# Patient Record
Sex: Male | Born: 1945 | Race: White | Hispanic: No | Marital: Single | Smoking: Current some day smoker
Health system: Southern US, Community
[De-identification: ages and names within clinical notes are randomized; demographics above are authoritative.]

## PROBLEM LIST (undated history)

## (undated) DIAGNOSIS — F32A Depression, unspecified: Secondary | ICD-10-CM

## (undated) DIAGNOSIS — K7460 Unspecified cirrhosis of liver: Secondary | ICD-10-CM

## (undated) DIAGNOSIS — H269 Unspecified cataract: Secondary | ICD-10-CM

## (undated) DIAGNOSIS — H9190 Unspecified hearing loss, unspecified ear: Secondary | ICD-10-CM

## (undated) DIAGNOSIS — R1310 Dysphagia, unspecified: Secondary | ICD-10-CM

## (undated) DIAGNOSIS — M5030 Other cervical disc degeneration, unspecified cervical region: Secondary | ICD-10-CM

## (undated) DIAGNOSIS — K219 Gastro-esophageal reflux disease without esophagitis: Secondary | ICD-10-CM

## (undated) DIAGNOSIS — H16009 Unspecified corneal ulcer, unspecified eye: Secondary | ICD-10-CM

## (undated) DIAGNOSIS — J302 Other seasonal allergic rhinitis: Secondary | ICD-10-CM

## (undated) DIAGNOSIS — J309 Allergic rhinitis, unspecified: Secondary | ICD-10-CM

## (undated) DIAGNOSIS — C760 Malignant neoplasm of head, face and neck: Secondary | ICD-10-CM

## (undated) DIAGNOSIS — F329 Major depressive disorder, single episode, unspecified: Secondary | ICD-10-CM

## (undated) HISTORY — DX: Allergic rhinitis, unspecified: J30.9

## (undated) HISTORY — PX: TONSILLECTOMY: SUR1361

## (undated) HISTORY — DX: Depression, unspecified: F32.A

## (undated) HISTORY — DX: Other seasonal allergic rhinitis: J30.2

## (undated) HISTORY — DX: Other cervical disc degeneration, unspecified cervical region: M50.30

## (undated) HISTORY — DX: Dysphagia, unspecified: R13.10

## (undated) HISTORY — DX: Major depressive disorder, single episode, unspecified: F32.9

## (undated) HISTORY — PX: EYE SURGERY: SHX253

## (undated) HISTORY — PX: ESOPHAGOGASTRODUODENOSCOPY: SHX1529

## (undated) HISTORY — DX: Malignant neoplasm of head, face and neck: C76.0

## (undated) HISTORY — DX: Unspecified hearing loss, unspecified ear: H91.90

## (undated) HISTORY — DX: Unspecified cataract: H26.9

## (undated) HISTORY — DX: Unspecified cirrhosis of liver: K74.60

## (undated) HISTORY — PX: PEG PLACEMENT: SHX5437

## (undated) HISTORY — PX: LARYNGOSCOPY: SUR817

## (undated) HISTORY — DX: Gastro-esophageal reflux disease without esophagitis: K21.9

## (undated) HISTORY — DX: Unspecified corneal ulcer, unspecified eye: H16.009

## (undated) HISTORY — PX: TRACHEOSTOMY: SUR1362

---

## 2016-02-26 ENCOUNTER — Telehealth: Payer: Self-pay

## 2016-02-26 NOTE — Telephone Encounter (Signed)
I received a call from Hospice on this patient for leaking PEG tube. He had the tube put in at [facility] in 05/2015 and then it came out and had to go to [facility] ER to have it replaced on 01/2016. It has been doing okay until about a week ago. It is leaking around where it goes into his stomach. He is able to use it but he  Just want to fix the leaking. I have [facility] sending over the ER notes. His PCP is Dr. Orlena Sheldon in [place]. I am going to try and fine out who put in the PEG at [facility].

## 2016-02-28 NOTE — Telephone Encounter (Signed)
I was not here Thursday or Friday. Has this been addressed?

## 2016-02-29 NOTE — Telephone Encounter (Signed)
Noted. Thanks.

## 2016-02-29 NOTE — Telephone Encounter (Signed)
Yes he is coming in for an office visit on 03/01/16. He is able to use the PEG but it is just leaking around it. Just wanted you to know what was going on

## 2016-03-01 ENCOUNTER — Ambulatory Visit: Payer: Self-pay | Admitting: Gastroenterology

## 2016-03-16 ENCOUNTER — Ambulatory Visit (INDEPENDENT_AMBULATORY_CARE_PROVIDER_SITE_OTHER): Admitting: Nurse Practitioner

## 2016-03-16 ENCOUNTER — Encounter: Payer: Self-pay | Admitting: Nurse Practitioner

## 2016-03-16 ENCOUNTER — Telehealth: Payer: Self-pay

## 2016-03-16 ENCOUNTER — Encounter (HOSPITAL_COMMUNITY): Payer: Self-pay | Admitting: *Deleted

## 2016-03-16 ENCOUNTER — Ambulatory Visit (HOSPITAL_COMMUNITY)
Admission: RE | Admit: 2016-03-16 | Discharge: 2016-03-16 | Disposition: A | Discharge status: Home / Self Care | Source: Ambulatory Visit | Attending: Gastroenterology | Admitting: Gastroenterology

## 2016-03-16 ENCOUNTER — Ambulatory Visit (HOSPITAL_COMMUNITY)
Admission: RE | Admit: 2016-03-16 | Discharge: 2016-03-16 | Disposition: A | Discharge status: Home / Self Care | Source: Ambulatory Visit | Attending: Nurse Practitioner | Admitting: Nurse Practitioner

## 2016-03-16 ENCOUNTER — Encounter (HOSPITAL_COMMUNITY)
Admission: RE | Disposition: A | Discharge status: Home / Self Care | Payer: Self-pay | Source: Ambulatory Visit | Attending: Gastroenterology

## 2016-03-16 ENCOUNTER — Other Ambulatory Visit: Payer: Self-pay

## 2016-03-16 VITALS — BP 120/76 | HR 104 | Temp 97.7°F | Ht 65.0 in | Wt 113.2 lb

## 2016-03-16 DIAGNOSIS — K9423 Gastrostomy malfunction: Secondary | ICD-10-CM | POA: Insufficient documentation

## 2016-03-16 DIAGNOSIS — F329 Major depressive disorder, single episode, unspecified: Secondary | ICD-10-CM | POA: Diagnosis not present

## 2016-03-16 DIAGNOSIS — C760 Malignant neoplasm of head, face and neck: Secondary | ICD-10-CM | POA: Diagnosis not present

## 2016-03-16 DIAGNOSIS — R1310 Dysphagia, unspecified: Secondary | ICD-10-CM | POA: Insufficient documentation

## 2016-03-16 DIAGNOSIS — Z431 Encounter for attention to gastrostomy: Secondary | ICD-10-CM

## 2016-03-16 DIAGNOSIS — R110 Nausea: Secondary | ICD-10-CM | POA: Insufficient documentation

## 2016-03-16 DIAGNOSIS — F17210 Nicotine dependence, cigarettes, uncomplicated: Secondary | ICD-10-CM | POA: Insufficient documentation

## 2016-03-16 HISTORY — PX: PEG PLACEMENT: SHX5437

## 2016-03-16 SURGERY — REPLACEMENT, PEG TUBE, WITHOUT ENDOSCOPY
Anesthesia: LOCAL

## 2016-03-16 MED ORDER — SULFAMETHOXAZOLE-TRIMETHOPRIM 400-80 MG PO TABS: 1.0000 | ORAL_TABLET | Freq: Two times a day (BID) | ORAL | Status: DC

## 2016-03-16 MED ORDER — SODIUM CHLORIDE 0.9 % IV SOLN: INTRAVENOUS | Status: DC

## 2016-03-16 MED ORDER — CLOTRIMAZOLE 1 % EX CREA: 1.0000 "application " | TOPICAL_CREAM | Freq: Two times a day (BID) | CUTANEOUS | Status: AC

## 2016-03-16 MED ORDER — DIATRIZOATE MEGLUMINE & SODIUM 66-10 % PO SOLN
ORAL | Status: AC
  Filled 2016-03-16: qty 30

## 2016-03-16 NOTE — Telephone Encounter (Signed)
Open in error

## 2016-03-16 NOTE — Assessment & Plan Note (Addendum)
Patient with PEG placement at [facility] in July 2016. Was seen in the emergency department at [facility] in February 2017 for his PEG coming out. This was replaced with a different sized to based on supply availability. An 63 French tube was placed and a 20 [place] pole. Since then he has had persistent and worsening leaking of gastric juices and feeding from his PEG site. Noted air leak around the site as well. Skin appears with breakdown and drainage. Dr. Oneida Alar came into the exam room to examine the patient. At this point we will order a KUB with Gastrografin injection for PEG study to determine placement. The patient will be sent to endoscopy preprocedure area for possible bedside manipulation or tube change. Return for follow-up based on post manipulation recommendations.  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 He is dependent on PEG tube for all fluids and nutrition. He is having difficulty obtaining adequate adequate nutrition and fluids at this time due to the leak.

## 2016-03-16 NOTE — Interval H&P Note (Signed)
History and Physical Interval Note:  03/16/2016 1:32 PM  Philip Beck  has presented today for surgery, with the diagnosis of peg replacement  The various methods of treatment have been discussed with the patient and family. After consideration of risks, benefits and other options for treatment, the patient has consented to  Procedure(s) with comments: PERCUTANEOUS ENDOSCOPIC GASTROSTOMY (PEG) REPLACEMENT (N/A) - [id number] as a surgical intervention .  The patient's history has been reviewed, patient examined, no change in status, stable for surgery.  I have reviewed the patient's chart and labs.  Questions were answered to the patient's satisfaction.     [employer]

## 2016-03-16 NOTE — H&P (Addendum)
  Primary Care Physician:  Octavio Graves, DO Primary Gastroenterologist:  Dr. Oneida Alar  Pre-Procedure History & Physical: HPI:  Philip Beck is a 70 y.o. male here for PEG CHANGE-Cr 1.13 AT [facility] 13.  Past Medical History  Diagnosis Date  . Dysphagia   . 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 Depression   . GERD (gastroesophageal reflux disease)   . Corneal ulcer   . DDD (degenerative disc disease), cervical   . Cataract   . Cirrhosis ([place])   . Hearing loss   . Allergic rhinitis   . Seasonal allergies     Past Surgical History  Procedure Laterality Date  . Tracheostomy    . Peg placement    . Eye surgery Bilateral     cataract removeal  . Tonsillectomy    . Esophagogastroduodenoscopy    . Laryngoscopy      Prior to Admission medications   Medication Sig Start Date End Date Taking? Authorizing Provider  aspirin 325 MG tablet Take 325 mg by mouth daily.   Yes Historical Provider, MD  minocycline (DYNACIN) 50 MG tablet Take 50 mg by mouth 2 (two) times daily.   Yes Historical Provider, MD  oxyCODONE-acetaminophen (PERCOCET) 10-325 MG tablet Take 1 tablet by mouth every 4 (four) hours as needed for pain.   Yes Historical Provider, MD  ranitidine (ZANTAC) 150 MG capsule Take 150 mg by mouth 2 (two) times daily.   Yes Historical Provider, MD  spironolactone (ALDACTONE) 25 MG tablet Take 25 mg by mouth 2 (two) times daily.   Yes Historical Provider, MD  torsemide (DEMADEX) 20 MG tablet Take 20 mg by mouth daily.   Yes Historical Provider, MD    Allergies as of 03/16/2016 - Review Complete 03/16/2016  Allergen Reaction Noted  . Penicillins Itching 03/16/2016    Family History  Problem Relation Age of Onset  . Cancer Mother   . Stroke Father     Social History   Social History  . Marital Status: Single    Spouse Name: N/A  . Number of Children: N/A  . Years of Education: N/A   Occupational History  . Not on file.   Social History Main Topics  . Smoking status:  Current Some Day Smoker -- 0.25 packs/day    Types: Cigarettes  . Smokeless tobacco: Never Used  . Alcohol Use: No  . Drug Use: No  . Sexual Activity: Not on file   Other Topics Concern  . Not on file   Social History Narrative    Review of Systems: See HPI, otherwise negative ROS  Physical Exam: BP 107/71 mmHg  Pulse 102  Temp(Src) 98.5 F (36.9 C) (Oral)  Resp 17  Ht 5\' 5"  (1.651 m)  Wt 113 lb (51.256 kg)  BMI 18.80 kg/m2  SpO2 99% General:   Alert,  pleasant and cooperative in NAD Head:  Normocephalic and atraumatic. Neck:  TRACH IN PLACE Skin: dressing with serosanguinous drainage, macular erythema, occasional subcutaneous emphysema and when pt VALSALVAS, AIR ESCAPES AROUND G TUBE. Abdomen:  Soft, nontender and nondistended. Normal bowel sounds, without guarding, and without rebound.   Neurologic:  Alert and  oriented x4;  grossly normal neurologically.   IMAGING STUDIES: G TUBE STUDY APR 12-G TUBE IN GASTRIC LUMEN IN ANTRUM. CONTRAST IN DUODENUM  Impression/Plan:     MALFUNCTIONING PEG  PLAN:  BEDSIDE PEG CHANGE

## 2016-03-16 NOTE — Assessment & Plan Note (Signed)
Patient with recurrent nausea, currently on hospice. Has nausea medication which is son states he is not taking. Recommend he try the nausea medicine provided by hospice and if it is not effective to call and notify them.

## 2016-03-16 NOTE — Progress Notes (Signed)
cc'ed to pcp °

## 2016-03-16 NOTE — Procedures (Signed)
   PROCEDURE TECHNIQUE:  PT PREPPED AND DRAPED. 20 fr BALLOON PEG REMOVED. BALLOON CHECKED. REPLACED WITH 20 FR BALLOON PEG. 6 CC STERILE WATER IN BALLOON. SKIN AT 4.0 CM. TOP OF BUMPER AT 5.5 CM. ALL PORTS FLUSHED AND ASPIRATED.  PLAN: 1. TUBE FEEDS/MEDS TODAY. 2. REASSESS APR 13 TO LOOSEN BUMPER 3. BACTRIM DS BID FOR 3 DAYS 4. HOLD ASA FOR 3 DAYS.

## 2016-03-16 NOTE — Patient Instructions (Signed)
1. Go to [facility] radiology department for an XRay to check your G-Tube placement 2. After that, go to endo where Dr. Oneida Alar will meet you to address the tube

## 2016-03-16 NOTE — Progress Notes (Signed)
Primary Care Physician:  Octavio Graves, DO Primary Gastroenterologist:  Dr. Oneida Alar  Chief Complaint  Patient presents with  . peg tube leaking    HPI:   Philip Beck is a 70 y.o. male who presents On referral from hospice for leaking PEG tube. Per phone note from hospice patient initially had PEG tube placed in July 2016 at [name] and this was subsequently replaced by [facility] about a month and a half to 2 months ago. No other notes provided.  Search of CareEverywhere found G-Tube placement note at [facility]: "The patient underwent G-tube placement on 06/11/2015. Feeds were advanced to bolus and the patient was tolerating this well. At the time of discharge, the patient was able to void spontaneously, have pain controlled with G-tube pain medication, and return to preoperative ambulatory status. Surgical wounds are healing well."  [facility] notes brought by the patient were reviewed including the emergency department visit on 01/07/2016 for G-tube that came out. The patient was nothing by mouth to time requested a tube to be replaced. Tube was replaced in the emergency room without difficulty and a KUB ordered to confirm placement, although patient eloped prior to x-ray.  Today he is accompanied by his son. Today he states it has been leaking since it was replaced, but recently has been leaking worse. States [facility] didn't have the right size, they placed a size 18 when it needed to be a size 22. Is not constantly leaking and bleeding. States he was recently in the hospital at [facility] and discharged 5 days ago, they were using it in the hospital and they asked the hospital and providers to address the leaking PEG tube "and they wouldn't." Is NPO and is dependent on the PEG tube. The area is excoriated and raw. Having pain around the tube. Is keeping a dressing around the site, just re-dressed it before coming today. Has nausea, is being  seen by hospice but he has not taken his nausea medication recently.    Past Medical History  Diagnosis Date  . Dysphagia   . 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 Depression   . GERD (gastroesophageal reflux disease)   . Corneal ulcer   . DDD (degenerative disc disease), cervical   . Cataract   . Cirrhosis ([place])   . Hearing loss   . Allergic rhinitis   . Seasonal allergies     Past Surgical History  Procedure Laterality Date  . Tracheostomy    . Peg placement    . Eye surgery Bilateral     cataract removeal  . Tonsillectomy    . Esophagogastroduodenoscopy    . Laryngoscopy      No current facility-administered medications for this visit.   No current outpatient prescriptions on file.   Facility-Administered Medications Ordered in Other Visits  Medication Dose Route Frequency Provider Last Rate Last Dose  . 0.9 %  sodium chloride infusion   Intravenous Continuous Danie Binder, MD      . diatrizoate meglumine-sodium (GASTROGRAFIN) 66-10 % solution           . diatrizoate meglumine-sodium (GASTROGRAFIN) 66-10 % solution             Allergies as of 03/16/2016 - Review Complete 03/16/2016  Allergen Reaction Noted  . Penicillins Itching 03/16/2016    Family History  Problem Relation Age of Onset  . Cancer Mother   . Stroke Father     Social  History   Social History  . Marital Status: Single    Spouse Name: N/A  . Number of Children: N/A  . Years of Education: N/A   Occupational History  . Not on file.   Social History Main Topics  . Smoking status: Current Some Day Smoker -- 0.25 packs/day    Types: Cigarettes  . Smokeless tobacco: Never Used  . Alcohol Use: No  . Drug Use: No  . Sexual Activity: Not on file   Other Topics Concern  . Not on file   Social History Narrative    Review of Systems: 10-point ROS negative except as per HPI.    Physical Exam: BP 120/76 mmHg  Pulse 104  Temp(Src) 97.7 F (36.5 C)  Ht 5\' 5"  (1.651 m)  Wt 113 lb 3.2 oz  (51.347 kg)  BMI 18.84 kg/m2 General:   Alert and oriented. Pleasant and cooperative. Appears thin.  Head:  Normocephalic and atraumatic. Eyes:  Without icterus, sclera clear and conjunctiva pink.  Ears:  Hard of hearing. Throat/Neck:  Trach tube in place, dressing intact, drainage on dressing noted. Cardiovascular:  S1, S2 present without murmurs appreciated. Normal pulses noted. Extremities without clubbing or edema. Respiratory:  Bilateral wheezes noted. No distress.  Trach in place with dressing intact. Persistent cough and sputum production noted. Gastrointestinal:  +BS, soft, and non-distended. TTP to area around PEG site. Dressing removed and moderate abount of bile/blood drainage noted. Air leak could be heard from the site with patient abdominal muscle use. No guarding or rebound. No masses appreciated.  Rectal:  Deferred  Skin:  Skin breakdown noted around the PEG site. Neurologic:  Alert and oriented x4;  grossly normal neurologically. Psych:  Alert and cooperative. Normal mood and affect. Heme/Lymph/Immune: No excessive bruising noted.    03/16/2016 1:23 PM   Disclaimer: This note was dictated with voice recognition software. Similar sounding words can inadvertently be transcribed and may not be corrected upon review.

## 2016-03-16 NOTE — H&P (View-Only) (Signed)
Primary Care Physician:  Octavio Graves, DO Primary Gastroenterologist:  Dr. Oneida Alar  Chief Complaint  Patient presents with  . peg tube leaking    HPI:   Philip Beck is a 70 y.o. male who presents On referral from hospice for leaking PEG tube. Per phone note from hospice patient initially had PEG tube placed in July 2016 at [facility] and this was subsequently replaced by [facility] about a month and a half to 2 months ago. No other notes provided.  Search of CareEverywhere found G-Tube placement note at [facility]: "The patient underwent G-tube placement on 06/11/2015. Feeds were advanced to bolus and the patient was tolerating this well. At the time of discharge, the patient was able to void spontaneously, have pain controlled with G-tube pain medication, and return to preoperative ambulatory status. Surgical wounds are healing well."  [facility] notes brought by the patient were reviewed including the emergency department visit on 01/07/2016 for G-tube that came out. The patient was nothing by mouth to time requested a tube to be replaced. Tube was replaced in the emergency room without difficulty and a KUB ordered to confirm placement, although patient eloped prior to x-ray.  Today he is accompanied by his son. Today he states it has been leaking since it was replaced, but recently has been leaking worse. States [facility] didn't have the right size, they placed a size 18 when it needed to be a size 22. Is not constantly leaking and bleeding. States he was recently in the hospital at [facility] and discharged 5 days ago, they were using it in the hospital and they asked the hospital and providers to address the leaking PEG tube "and they wouldn't." Is NPO and is dependent on the PEG tube. The area is excoriated and raw. Having pain around the tube. Is keeping a dressing around the site, just re-dressed it before coming today. Has nausea, is being  seen by hospice but he has not taken his nausea medication recently.    Past Medical History  Diagnosis Date  . Dysphagia   . 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 Depression   . GERD (gastroesophageal reflux disease)   . Corneal ulcer   . DDD (degenerative disc disease), cervical   . Cataract   . Cirrhosis ([place])   . Hearing loss   . Allergic rhinitis   . Seasonal allergies     Past Surgical History  Procedure Laterality Date  . Tracheostomy    . Peg placement    . Eye surgery Bilateral     cataract removeal  . Tonsillectomy    . Esophagogastroduodenoscopy    . Laryngoscopy      No current facility-administered medications for this visit.   No current outpatient prescriptions on file.   Facility-Administered Medications Ordered in Other Visits  Medication Dose Route Frequency Provider Last Rate Last Dose  . 0.9 %  sodium chloride infusion   Intravenous Continuous Danie Binder, MD      . diatrizoate meglumine-sodium (GASTROGRAFIN) 66-10 % solution           . diatrizoate meglumine-sodium (GASTROGRAFIN) 66-10 % solution             Allergies as of 03/16/2016 - Review Complete 03/16/2016  Allergen Reaction Noted  . Penicillins Itching 03/16/2016    Family History  Problem Relation Age of Onset  . Cancer Mother   . Stroke Father     Social  History   Social History  . Marital Status: Single    Spouse Name: N/A  . Number of Children: N/A  . Years of Education: N/A   Occupational History  . Not on file.   Social History Main Topics  . Smoking status: Current Some Day Smoker -- 0.25 packs/day    Types: Cigarettes  . Smokeless tobacco: Never Used  . Alcohol Use: No  . Drug Use: No  . Sexual Activity: Not on file   Other Topics Concern  . Not on file   Social History Narrative    Review of Systems: 10-point ROS negative except as per HPI.    Physical Exam: BP 120/76 mmHg  Pulse 104  Temp(Src) 97.7 F (36.5 C)  Ht 5\' 5"  (1.651 m)  Wt 113 lb 3.2 oz  (51.347 kg)  BMI 18.84 kg/m2 General:   Alert and oriented. Pleasant and cooperative. Appears thin.  Head:  Normocephalic and atraumatic. Eyes:  Without icterus, sclera clear and conjunctiva pink.  Ears:  Hard of hearing. Throat/Neck:  Trach tube in place, dressing intact, drainage on dressing noted. Cardiovascular:  S1, S2 present without murmurs appreciated. Normal pulses noted. Extremities without clubbing or edema. Respiratory:  Bilateral wheezes noted. No distress.  Trach in place with dressing intact. Persistent cough and sputum production noted. Gastrointestinal:  +BS, soft, and non-distended. TTP to area around PEG site. Dressing removed and moderate abount of bile/blood drainage noted. Air leak could be heard from the site with patient abdominal muscle use. No guarding or rebound. No masses appreciated.  Rectal:  Deferred  Skin:  Skin breakdown noted around the PEG site. Neurologic:  Alert and oriented x4;  grossly normal neurologically. Psych:  Alert and cooperative. Normal mood and affect. Heme/Lymph/Immune: No excessive bruising noted.    03/16/2016 1:23 PM   Disclaimer: This note was dictated with voice recognition software. Similar sounding words can inadvertently be transcribed and may not be corrected upon review.

## 2016-03-16 NOTE — Discharge Instructions (Signed)
HOLD ASPIRIN FOR 3 DAYS.  TAKE BACTRIM DS TWICE DAILY FOR 3 DAYS. IT CAN CAUSE A RED RASH ALL OVER YOUR BODY.  ADD LOTRIMIN CREAM WHEN SKIN CLEANSED AND DRESSING CHANGED FOR 7 DAYS.  RETURN TO OFFICE OR I WILL MAKE A HOUSE CALL TO LOOSE BUMPER ON PEG.  CHANGE DRESSING AS NEEDED IF SOILED.  PLEASE CALL WITH QUESTIONS OR CONCERNS.

## 2016-03-16 NOTE — Telephone Encounter (Signed)
I spoke with the patient's daughter-in-law(DawnCrumpler) and she states the patient had his Peg Tube placed back in 06/2015 at [facility] and it was replaced back by [facility] when the patient came in through the ER 01/07/16.

## 2016-03-17 ENCOUNTER — Telehealth: Payer: Self-pay | Admitting: Gastroenterology

## 2016-03-20 NOTE — Telephone Encounter (Signed)
IN CONTACT WITH SON-DONALD, JR. FRI APR 15 PT DID NOT WANT TO COME TO FACILITY TO HAVE PEG BUMPER LOOSENED. TUBE DOING BETTER AND LEAKING LESS AROUND IT. MADE HOUSE CALL APR 16 ~0945 AT [address], [place]. LOOSENED BUMPER ON 20 Fr TUBE. TOP OF BUMPER MOVED TO 6 CM FROM 5.5 CM. OVERNIGHT LEAKING AROUND TUBE INCREASED. RETURNED TO HOME. REPLACED 20 Fr TUBE WITH 24 Fr TUBE. TOP OF BUMPER AT 5 CM. HOSPICE NURSE PAYING HOME VISIT TOMORROW. SON WILL CALL WITH QUESTIONS OR CONCERNS. MAY NEED TELEPHONE CONSULT FROM [facility] IF LEAKING AROUND TUBE CONTINUES.

## 2016-03-21 ENCOUNTER — Inpatient Hospital Stay (HOSPITAL_COMMUNITY)
Admission: EM | Admit: 2016-03-21 | Discharge: 2016-03-27 | DRG: 871 | Disposition: A | Discharge status: Hospice | Attending: Internal Medicine | Admitting: Internal Medicine

## 2016-03-21 ENCOUNTER — Emergency Department (HOSPITAL_COMMUNITY)

## 2016-03-21 ENCOUNTER — Encounter (HOSPITAL_COMMUNITY): Payer: Self-pay | Admitting: Gastroenterology

## 2016-03-21 DIAGNOSIS — Z880 Allergy status to penicillin: Secondary | ICD-10-CM

## 2016-03-21 DIAGNOSIS — R52 Pain, unspecified: Secondary | ICD-10-CM | POA: Diagnosis not present

## 2016-03-21 DIAGNOSIS — J189 Pneumonia, unspecified organism: Secondary | ICD-10-CM | POA: Diagnosis present

## 2016-03-21 DIAGNOSIS — Z515 Encounter for palliative care: Secondary | ICD-10-CM | POA: Insufficient documentation

## 2016-03-21 DIAGNOSIS — Z930 Tracheostomy status: Secondary | ICD-10-CM

## 2016-03-21 DIAGNOSIS — D638 Anemia in other chronic diseases classified elsewhere: Secondary | ICD-10-CM | POA: Diagnosis present

## 2016-03-21 DIAGNOSIS — Z79899 Other long term (current) drug therapy: Secondary | ICD-10-CM

## 2016-03-21 DIAGNOSIS — K219 Gastro-esophageal reflux disease without esophagitis: Secondary | ICD-10-CM | POA: Diagnosis present

## 2016-03-21 DIAGNOSIS — D72829 Elevated white blood cell count, unspecified: Secondary | ICD-10-CM | POA: Diagnosis present

## 2016-03-21 DIAGNOSIS — H9190 Unspecified hearing loss, unspecified ear: Secondary | ICD-10-CM | POA: Diagnosis present

## 2016-03-21 DIAGNOSIS — F329 Major depressive disorder, single episode, unspecified: Secondary | ICD-10-CM | POA: Diagnosis present

## 2016-03-21 DIAGNOSIS — Y95 Nosocomial condition: Secondary | ICD-10-CM | POA: Diagnosis present

## 2016-03-21 DIAGNOSIS — Z931 Gastrostomy status: Secondary | ICD-10-CM

## 2016-03-21 DIAGNOSIS — C760 Malignant neoplasm of head, face and neck: Secondary | ICD-10-CM | POA: Diagnosis present

## 2016-03-21 DIAGNOSIS — D649 Anemia, unspecified: Secondary | ICD-10-CM | POA: Diagnosis present

## 2016-03-21 DIAGNOSIS — A419 Sepsis, unspecified organism: Principal | ICD-10-CM | POA: Diagnosis present

## 2016-03-21 DIAGNOSIS — Z66 Do not resuscitate: Secondary | ICD-10-CM | POA: Diagnosis present

## 2016-03-21 DIAGNOSIS — R1310 Dysphagia, unspecified: Secondary | ICD-10-CM | POA: Diagnosis present

## 2016-03-21 DIAGNOSIS — K7030 Alcoholic cirrhosis of liver without ascites: Secondary | ICD-10-CM | POA: Diagnosis present

## 2016-03-21 DIAGNOSIS — K7460 Unspecified cirrhosis of liver: Secondary | ICD-10-CM | POA: Diagnosis present

## 2016-03-21 DIAGNOSIS — F17210 Nicotine dependence, cigarettes, uncomplicated: Secondary | ICD-10-CM | POA: Diagnosis present

## 2016-03-21 DIAGNOSIS — J9621 Acute and chronic respiratory failure with hypoxia: Secondary | ICD-10-CM | POA: Diagnosis present

## 2016-03-21 DIAGNOSIS — Z8589 Personal history of malignant neoplasm of other organs and systems: Secondary | ICD-10-CM

## 2016-03-21 LAB — PROTIME-INR
INR: 1.43 (ref 0.00–1.49)
Prothrombin Time: 17.6 seconds — ABNORMAL HIGH (ref 11.6–15.2)

## 2016-03-21 LAB — CBC WITH DIFFERENTIAL/PLATELET
BASOS ABS: 0 10*3/uL (ref 0.0–0.1)
BASOS PCT: 0 %
EOS ABS: 0.3 10*3/uL (ref 0.0–0.7)
Eosinophils Relative: 1 %
HEMATOCRIT: 31.3 % — AB (ref 39.0–52.0)
HEMOGLOBIN: 9.7 g/dL — AB (ref 13.0–17.0)
LYMPHS PCT: 4 %
Lymphs Abs: 1 10*3/uL (ref 0.7–4.0)
MCH: 27 pg (ref 26.0–34.0)
MCHC: 31 g/dL (ref 30.0–36.0)
MCV: 87.2 fL (ref 78.0–100.0)
MONOS PCT: 9 %
Monocytes Absolute: 2.3 10*3/uL — ABNORMAL HIGH (ref 0.1–1.0)
NEUTROS PCT: 86 %
Neutro Abs: 21.5 10*3/uL — ABNORMAL HIGH (ref 1.7–7.7)
Platelets: 296 10*3/uL (ref 150–400)
RBC: 3.59 MIL/uL — ABNORMAL LOW (ref 4.22–5.81)
RDW: 15.4 % (ref 11.5–15.5)
WBC: 25.1 10*3/uL — ABNORMAL HIGH (ref 4.0–10.5)

## 2016-03-21 LAB — BASIC METABOLIC PANEL
Anion gap: 12 (ref 5–15)
BUN: 45 mg/dL — ABNORMAL HIGH (ref 6–20)
CHLORIDE: 96 mmol/L — AB (ref 101–111)
CO2: 26 mmol/L (ref 22–32)
Calcium: 9 mg/dL (ref 8.9–10.3)
Creatinine, Ser: 1.31 mg/dL — ABNORMAL HIGH (ref 0.61–1.24)
GFR calc non Af Amer: 55 mL/min — ABNORMAL LOW (ref >60)
Glucose, Bld: 119 mg/dL — ABNORMAL HIGH (ref 65–99)
POTASSIUM: 4.7 mmol/L (ref 3.5–5.1)
SODIUM: 134 mmol/L — AB (ref 135–145)

## 2016-03-21 LAB — I-STAT CG4 LACTIC ACID, ED: LACTIC ACID, VENOUS: 2.57 mmol/L — AB (ref 0.5–2.0)

## 2016-03-21 MED ORDER — DEXTROSE 5 % IV SOLN
2.0000 g | INTRAVENOUS | Status: DC
  Administered 2016-03-22 – 2016-03-23 (×2): 2 g via INTRAVENOUS
  Filled 2016-03-21 (×3): qty 2

## 2016-03-21 MED ORDER — SODIUM CHLORIDE 0.9 % IV BOLUS (SEPSIS)
1000.0000 mL | INTRAVENOUS | Status: AC
  Administered 2016-03-21 – 2016-03-22 (×2): 1000 mL via INTRAVENOUS

## 2016-03-21 MED ORDER — DEXTROSE 5 % IV SOLN
2.0000 g | Freq: Once | INTRAVENOUS | Status: AC
  Administered 2016-03-21: 2 g via INTRAVENOUS
  Filled 2016-03-21: qty 2

## 2016-03-21 MED ORDER — VANCOMYCIN HCL IN DEXTROSE 1-5 GM/200ML-% IV SOLN
1000.0000 mg | Freq: Once | INTRAVENOUS | Status: AC
  Administered 2016-03-21: 1000 mg via INTRAVENOUS
  Filled 2016-03-21: qty 200

## 2016-03-21 MED ORDER — VANCOMYCIN HCL IN DEXTROSE 750-5 MG/150ML-% IV SOLN
750.0000 mg | INTRAVENOUS | Status: DC
  Administered 2016-03-22 – 2016-03-23 (×2): 750 mg via INTRAVENOUS
  Filled 2016-03-21 (×4): qty 150

## 2016-03-21 NOTE — ED Notes (Signed)
Pt is trach patient, unable to speak, what is understood is feeling SOB. He denies pain. Sats 100% RA  lung sounds diminished. HAd trach cleaned today per family member.

## 2016-03-21 NOTE — ED Notes (Signed)
MD at bedside. 

## 2016-03-21 NOTE — Progress Notes (Signed)
Pharmacy Antibiotic Note  Philip Beck is a 69 y.o. male admitted on 03/21/2016 with pneumonia.  Pharmacy has been consulted for Vancomycin/Cefepime dosing. Hx of CA with trach in place. WBC elevated, mild bump in Scr, other labs reviewed.   Plan: -Vancomycin 750 mg IV q24h -Cefepime 2g IV q24h -Trend WBC, temp, renal function  -Drug levels as indicated   Temp (24hrs), Avg:99.1 F (37.3 C), Min:99.1 F (37.3 C), Max:99.1 F (37.3 C)   Recent Labs Lab 03/21/16 2302 03/21/16 2323  WBC 25.1*  --   CREATININE 1.31*  --   LATICACIDVEN  --  2.57*    Estimated Creatinine Clearance: 39.7 mL/min (by C-G formula based on Cr of 1.31).    Allergies  Allergen Reactions  . Penicillins Itching    Has patient had a PCN reaction causing immediate rash, facial/tongue/throat swelling, SOB or lightheadedness with hypotension:YES Has patient had a PCN reaction causing severe rash involving mucus membranes or skin necrosis: NO Has patient had a PCN reaction that required hospitalization NO Has patient had a PCN reaction occurring within the last 10 years: NO If all of the above answers are "NO", then may proceed with Cephalosporin use.     Narda Bonds 03/21/2016 11:55 PM

## 2016-03-21 NOTE — ED Provider Notes (Signed)
CSN: [id number]     Arrival date & time 03/21/16  2136 History   First MD Initiated Contact with Patient 03/21/16 2155     Chief Complaint  Patient presents with  . Shortness of Breath     (Consider location/radiation/quality/duration/timing/severity/associated sxs/prior Treatment) HPI Comments: Pt comes in with cc of DIB. 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 No active chemo/radiation. Pt is DNR status and HOSPICE is following. Pt has been having increased cough x 2 days, and today he started having sudden onset dyspnea. EMS was called. Pt refused to go to [facility] via EMS, so family drove him here. Pt started breathing more comfortably. He reports that he has had thick sputum now. Denies fevers, chest pain, chills.   ROS 10 Systems reviewed and are negative for acute change except as noted in the HPI.     Patient is a 70 y.o. male presenting with shortness of breath. The history is provided by the patient.  Shortness of Breath   Past Medical History  Diagnosis Date  . Dysphagia   . Head and neck cancer ([place])   . Depression   . GERD (gastroesophageal reflux disease)   . Corneal ulcer   . DDD (degenerative disc disease), cervical   . Cataract   . Cirrhosis ([place])   . Hearing loss   . Allergic rhinitis   . Seasonal allergies    Past Surgical History  Procedure Laterality Date  . Tracheostomy    . Peg placement    . Eye surgery Bilateral     cataract removeal  . Tonsillectomy    . Esophagogastroduodenoscopy    . Laryngoscopy    . Peg placement N/A 03/16/2016    Procedure: PERCUTANEOUS ENDOSCOPIC GASTROSTOMY (PEG) REPLACEMENT;  Surgeon: Danie Binder, MD;  Location: [facility];  Service: Endoscopy;  Laterality: N/A;  1245   Family History  Problem Relation Age of Onset  . Cancer Mother   . Stroke Father    Social History  Substance Use Topics  . Smoking status: Current Some Day Smoker -- 0.25 packs/day    Types: Cigarettes  . Smokeless tobacco:  Never Used  . Alcohol Use: No    Review of Systems      Allergies  Penicillins  Home Medications   Prior to Admission medications   Medication Sig Start Date End Date Taking? Authorizing Provider  azithromycin (ZITHROMAX) 500 MG tablet Take 1,000 mg by mouth 2 (two) times daily.   Yes Historical Provider, MD  clotrimazole (LOTRIMIN) 1 % cream Apply 1 application topically 2 (two) times daily. FOR 7 DAYS 03/16/16  Yes Danie Binder, MD  guaiFENesin (ROBITUSSIN) 100 MG/5ML liquid Take 200 mg by mouth daily. Take one every day   Yes Historical Provider, MD  oxyCODONE-acetaminophen (PERCOCET) 10-325 MG tablet Take 1 tablet by mouth every 4 (four) hours as needed for pain.   Yes Historical Provider, MD  ranitidine (ZANTAC) 150 MG capsule Take 150 mg by mouth 2 (two) times daily.   Yes Historical Provider, MD  silver sulfADIAZINE (SILVADENE) 1 % cream Apply 1 application topically daily as needed. For rash/burn   Yes Historical Provider, MD  spironolactone (ALDACTONE) 25 MG tablet Take 25 mg by mouth 2 (two) times daily.   Yes Historical Provider, MD  torsemide (DEMADEX) 20 MG tablet Take 20 mg by mouth daily.   Yes Historical Provider, MD   BP 97/61 mmHg  Pulse 100  Temp(Src) 99.1 F (37.3  C) (Oral)  Resp 26  SpO2 100% Physical Exam  Constitutional: He is oriented to person, place, and time. He appears well-developed.  HENT:  Head: Atraumatic.  Pt has a trach in place, 4-0 cuffed. He has yellow, thick mucus draining. Pt has a large neck mass No stridor  Neck: Neck supple.  Cardiovascular: Normal rate.   Pulmonary/Chest: Effort normal. No respiratory distress. He has no wheezes. He has no rales.  Neurological: He is alert and oriented to person, place, and time.  Skin: Skin is warm.  Nursing note and vitals reviewed.   ED Course  Procedures (including critical care time) Labs Review Labs Reviewed  I-STAT CG4 LACTIC ACID, ED - Abnormal; Notable for the following:     Lactic Acid, Venous 2.57 (*)    All other components within normal limits  CULTURE, BLOOD (ROUTINE X 2)  CULTURE, BLOOD (ROUTINE X 2)  URINE CULTURE  CULTURE, EXPECTORATED SPUTUM-ASSESSMENT  GRAM STAIN  CBC WITH DIFFERENTIAL/PLATELET  BASIC METABOLIC PANEL  URINALYSIS, ROUTINE W REFLEX MICROSCOPIC (NOT AT [facility])  PROTIME-INR    Imaging Review Dg Chest Port 1 View  03/21/2016  CLINICAL DATA:  Shortness of breath, trach EXAM: PORTABLE CHEST 1 VIEW COMPARISON:  03/08/2016 FINDINGS: Mild bibasilar opacities, atelectasis versus pneumonia. No pleural effusion or pneumothorax. Tracheostomy in satisfactory position. Heart is normal in size. IMPRESSION: Mild bibasilar opacities, atelectasis versus pneumonia. Electronically Signed   By: Julian Hy M.D.   On: 03/21/2016 22:20   I have personally reviewed and evaluated these images and lab results as part of my medical decision-making.   EKG Interpretation   Date/Time:  Monday March 21 2016 21:46:12 EDT Ventricular Rate:  102 PR Interval:  142 QRS Duration: 82 QT Interval:  368 QTC Calculation: 479 R Axis:   29 Text Interpretation:  Sinus tachycardia with Premature atrial complexes  Low voltage QRS Borderline ECG No old tracing to compare Confirmed by  Kathrynn Humble, MD, Thelma Comp [phone]) on 03/21/2016 11:19:14 PM      MDM   Final diagnoses:  HCAP (healthcare-associated pneumonia)    PT comes in with cc of cough, dib. He had his airway apparatus switched out 2 weeks ago whilst at [employer]. He has increased cough, thick sputum. No resp distress at home, but pt was in severe distress at home, and it likely was from a mucus plug. We will suction him, send sputum cultures and gram stain. Suspect Pneumonia, and pt will be HCAP.  Pt is not vent dependent it seems like - and thus RT reports that pt needs to be in an uncuffed trach and not cuffed - which makes sense, but they would prefer deferring the change to pulm/ccm due to the mass.  I  have spoke with Dr. Concepcion Living, CCM, patient will be on their list to f/u tomorrow.   Varney Biles, MD 03/21/16 [phone]

## 2016-03-21 NOTE — ED Notes (Signed)
Pt given suction to suction mouth and trach as needed.

## 2016-03-21 NOTE — Procedures (Signed)
Called to assess pt's trach.  Pt appeared to be in no distress. HR-103, RR-21, SpO2 on room air 100%. Lurline Idol is #4 cuffed shiley. Upon arrival the cuff was deflated and the inner cannula was removed by the family.  Trach site oozing, red, with crust on trach plate. The pt was coughing up brownish thick secretions.  RT cleaned the inner cannula and placed in specimen cup, trach site cleaned, trach ties changes and pt lavaged and suctioned.  RT collected copious amount of thick, tan/yellow, secretions and sent sample to lab.  RT placed pt on room air ATC for humidity.

## 2016-03-21 NOTE — ED Notes (Signed)
Pt to ED for SOB with son at bedside. Son reports pt fell asleep and woke up in a panic with increased shortness of breath. Trach in place with holder

## 2016-03-22 ENCOUNTER — Inpatient Hospital Stay (HOSPITAL_COMMUNITY)

## 2016-03-22 DIAGNOSIS — Z515 Encounter for palliative care: Secondary | ICD-10-CM | POA: Diagnosis not present

## 2016-03-22 DIAGNOSIS — Z931 Gastrostomy status: Secondary | ICD-10-CM | POA: Diagnosis not present

## 2016-03-22 DIAGNOSIS — J189 Pneumonia, unspecified organism: Secondary | ICD-10-CM | POA: Diagnosis not present

## 2016-03-22 DIAGNOSIS — D649 Anemia, unspecified: Secondary | ICD-10-CM | POA: Diagnosis present

## 2016-03-22 DIAGNOSIS — Z930 Tracheostomy status: Secondary | ICD-10-CM | POA: Diagnosis not present

## 2016-03-22 DIAGNOSIS — R52 Pain, unspecified: Secondary | ICD-10-CM | POA: Diagnosis present

## 2016-03-22 DIAGNOSIS — F17210 Nicotine dependence, cigarettes, uncomplicated: Secondary | ICD-10-CM | POA: Diagnosis present

## 2016-03-22 DIAGNOSIS — D72829 Elevated white blood cell count, unspecified: Secondary | ICD-10-CM | POA: Diagnosis present

## 2016-03-22 DIAGNOSIS — R1310 Dysphagia, unspecified: Secondary | ICD-10-CM | POA: Diagnosis present

## 2016-03-22 DIAGNOSIS — Y95 Nosocomial condition: Secondary | ICD-10-CM | POA: Diagnosis present

## 2016-03-22 DIAGNOSIS — F329 Major depressive disorder, single episode, unspecified: Secondary | ICD-10-CM | POA: Diagnosis present

## 2016-03-22 DIAGNOSIS — J9621 Acute and chronic respiratory failure with hypoxia: Secondary | ICD-10-CM | POA: Diagnosis present

## 2016-03-22 DIAGNOSIS — Z66 Do not resuscitate: Secondary | ICD-10-CM | POA: Diagnosis present

## 2016-03-22 DIAGNOSIS — Z8589 Personal history of malignant neoplasm of other organs and systems: Secondary | ICD-10-CM | POA: Diagnosis not present

## 2016-03-22 DIAGNOSIS — Z79899 Other long term (current) drug therapy: Secondary | ICD-10-CM | POA: Diagnosis not present

## 2016-03-22 DIAGNOSIS — C760 Malignant neoplasm of head, face and neck: Secondary | ICD-10-CM | POA: Diagnosis present

## 2016-03-22 DIAGNOSIS — A419 Sepsis, unspecified organism: Secondary | ICD-10-CM | POA: Diagnosis present

## 2016-03-22 DIAGNOSIS — H9190 Unspecified hearing loss, unspecified ear: Secondary | ICD-10-CM | POA: Diagnosis present

## 2016-03-22 DIAGNOSIS — Z880 Allergy status to penicillin: Secondary | ICD-10-CM | POA: Diagnosis not present

## 2016-03-22 DIAGNOSIS — K219 Gastro-esophageal reflux disease without esophagitis: Secondary | ICD-10-CM | POA: Diagnosis present

## 2016-03-22 DIAGNOSIS — D638 Anemia in other chronic diseases classified elsewhere: Secondary | ICD-10-CM | POA: Diagnosis present

## 2016-03-22 DIAGNOSIS — K7460 Unspecified cirrhosis of liver: Secondary | ICD-10-CM | POA: Diagnosis present

## 2016-03-22 DIAGNOSIS — K7030 Alcoholic cirrhosis of liver without ascites: Secondary | ICD-10-CM | POA: Diagnosis present

## 2016-03-22 LAB — URINALYSIS, ROUTINE W REFLEX MICROSCOPIC
BILIRUBIN URINE: NEGATIVE
Glucose, UA: NEGATIVE mg/dL
Hgb urine dipstick: NEGATIVE
KETONES UR: NEGATIVE mg/dL
LEUKOCYTES UA: NEGATIVE
Nitrite: NEGATIVE
PH: 7.5 (ref 5.0–8.0)
Protein, ur: NEGATIVE mg/dL
SPECIFIC GRAVITY, URINE: 1.014 (ref 1.005–1.030)

## 2016-03-22 LAB — CBC
HEMATOCRIT: 26.2 % — AB (ref 39.0–52.0)
Hemoglobin: 8.2 g/dL — ABNORMAL LOW (ref 13.0–17.0)
MCH: 27.2 pg (ref 26.0–34.0)
MCHC: 31.3 g/dL (ref 30.0–36.0)
MCV: 86.8 fL (ref 78.0–100.0)
Platelets: 248 10*3/uL (ref 150–400)
RBC: 3.02 MIL/uL — AB (ref 4.22–5.81)
RDW: 15.7 % — AB (ref 11.5–15.5)
WBC: 20.6 10*3/uL — AB (ref 4.0–10.5)

## 2016-03-22 LAB — BASIC METABOLIC PANEL
Anion gap: 6 (ref 5–15)
BUN: 35 mg/dL — AB (ref 6–20)
CHLORIDE: 102 mmol/L (ref 101–111)
CO2: 25 mmol/L (ref 22–32)
Calcium: 7.7 mg/dL — ABNORMAL LOW (ref 8.9–10.3)
Creatinine, Ser: 1.06 mg/dL (ref 0.61–1.24)
GFR calc Af Amer: >60 mL/min (ref >60)
GFR calc non Af Amer: >60 mL/min (ref >60)
Glucose, Bld: 129 mg/dL — ABNORMAL HIGH (ref 65–99)
POTASSIUM: 3.9 mmol/L (ref 3.5–5.1)
SODIUM: 133 mmol/L — AB (ref 135–145)

## 2016-03-22 LAB — I-STAT CG4 LACTIC ACID, ED: LACTIC ACID, VENOUS: 0.8 mmol/L (ref 0.5–2.0)

## 2016-03-22 LAB — PROCALCITONIN

## 2016-03-22 LAB — LACTIC ACID, PLASMA
Lactic Acid, Venous: 1.1 mmol/L (ref 0.5–2.0)
Lactic Acid, Venous: 1.3 mmol/L (ref 0.5–2.0)

## 2016-03-22 LAB — CBG MONITORING, ED: Glucose-Capillary: 128 mg/dL — ABNORMAL HIGH (ref 65–99)

## 2016-03-22 LAB — APTT: aPTT: 34 seconds (ref 24–37)

## 2016-03-22 LAB — AMMONIA: Ammonia: 11 umol/L (ref 9–35)

## 2016-03-22 LAB — PROTIME-INR
INR: 1.64 — ABNORMAL HIGH (ref 0.00–1.49)
Prothrombin Time: 19.5 seconds — ABNORMAL HIGH (ref 11.6–15.2)

## 2016-03-22 LAB — MRSA PCR SCREENING: MRSA by PCR: NEGATIVE

## 2016-03-22 MED ORDER — ONDANSETRON HCL 4 MG/2ML IJ SOLN: 4.0000 mg | Freq: Four times a day (QID) | INTRAMUSCULAR | Status: DC | PRN

## 2016-03-22 MED ORDER — GUAIFENESIN 100 MG/5ML PO SOLN
10.0000 mL | Freq: Every day | ORAL | Status: DC
  Administered 2016-03-22 – 2016-03-27 (×6): 200 mg via ORAL
  Filled 2016-03-22 (×6): qty 10

## 2016-03-22 MED ORDER — ALBUTEROL SULFATE (2.5 MG/3ML) 0.083% IN NEBU: 2.5000 mg | INHALATION_SOLUTION | Freq: Four times a day (QID) | RESPIRATORY_TRACT | Status: DC | PRN

## 2016-03-22 MED ORDER — ACETAMINOPHEN 650 MG RE SUPP: 650.0000 mg | Freq: Four times a day (QID) | RECTAL | Status: DC | PRN

## 2016-03-22 MED ORDER — ONDANSETRON HCL 4 MG PO TABS: 4.0000 mg | ORAL_TABLET | Freq: Four times a day (QID) | ORAL | Status: DC | PRN

## 2016-03-22 MED ORDER — TORSEMIDE 20 MG PO TABS
20.0000 mg | ORAL_TABLET | Freq: Every day | ORAL | Status: DC
  Administered 2016-03-22 – 2016-03-27 (×6): 20 mg via ORAL
  Filled 2016-03-22 (×6): qty 1

## 2016-03-22 MED ORDER — ENOXAPARIN SODIUM 40 MG/0.4ML ~~LOC~~ SOLN
40.0000 mg | SUBCUTANEOUS | Status: DC
  Administered 2016-03-22 – 2016-03-27 (×6): 40 mg via SUBCUTANEOUS
  Filled 2016-03-22 (×6): qty 0.4

## 2016-03-22 MED ORDER — ACETAMINOPHEN 325 MG PO TABS: 650.0000 mg | ORAL_TABLET | Freq: Four times a day (QID) | ORAL | Status: DC | PRN

## 2016-03-22 MED ORDER — ALBUTEROL SULFATE (2.5 MG/3ML) 0.083% IN NEBU: 2.5000 mg | INHALATION_SOLUTION | RESPIRATORY_TRACT | Status: DC | PRN

## 2016-03-22 MED ORDER — OXYCODONE HCL 5 MG PO TABS
5.0000 mg | ORAL_TABLET | ORAL | Status: DC | PRN
  Administered 2016-03-22 – 2016-03-25 (×6): 5 mg via ORAL
  Filled 2016-03-22 (×7): qty 1

## 2016-03-22 MED ORDER — GUAIFENESIN 100 MG/5ML PO LIQD: 200.0000 mg | Freq: Every day | ORAL | Status: DC

## 2016-03-22 MED ORDER — SODIUM CHLORIDE 0.9 % IV SOLN
INTRAVENOUS | Status: DC
  Administered 2016-03-22 (×2): via INTRAVENOUS

## 2016-03-22 MED ORDER — SPIRONOLACTONE 25 MG PO TABS
25.0000 mg | ORAL_TABLET | Freq: Two times a day (BID) | ORAL | Status: DC
  Administered 2016-03-22 – 2016-03-27 (×10): 25 mg via ORAL
  Filled 2016-03-22 (×11): qty 1

## 2016-03-22 MED ORDER — HYDROMORPHONE HCL 1 MG/ML IJ SOLN
0.5000 mg | INTRAMUSCULAR | Status: DC | PRN
  Administered 2016-03-22: 0.5 mg via INTRAVENOUS
  Administered 2016-03-24 – 2016-03-27 (×10): 1 mg via INTRAVENOUS
  Administered 2016-03-27: 0.5 mg via INTRAVENOUS
  Filled 2016-03-22 (×12): qty 1

## 2016-03-22 MED ORDER — ALBUTEROL SULFATE (2.5 MG/3ML) 0.083% IN NEBU
2.5000 mg | INHALATION_SOLUTION | Freq: Three times a day (TID) | RESPIRATORY_TRACT | Status: DC
  Administered 2016-03-22: 2.5 mg via RESPIRATORY_TRACT
  Filled 2016-03-22 (×2): qty 3

## 2016-03-22 MED ORDER — ALBUTEROL SULFATE (2.5 MG/3ML) 0.083% IN NEBU
2.5000 mg | INHALATION_SOLUTION | Freq: Four times a day (QID) | RESPIRATORY_TRACT | Status: DC
  Administered 2016-03-22: 2.5 mg via RESPIRATORY_TRACT
  Filled 2016-03-22: qty 3

## 2016-03-22 NOTE — ED Notes (Signed)
Admitting MD at bedside.

## 2016-03-22 NOTE — ED Notes (Signed)
RT at bedside or trach suction

## 2016-03-22 NOTE — ED Notes (Signed)
Pt requesting to be suctioned; RT aware

## 2016-03-22 NOTE — ED Notes (Signed)
Pt found attempting to get out of bed; had removed both IVs. Pt assisted back to bed, cbg checked. Portable xray at bedside.

## 2016-03-22 NOTE — ED Notes (Signed)
Attempted report 

## 2016-03-22 NOTE — ED Notes (Signed)
pts son spoke with Candiss Norse, MD re: palliative care, MD to place consult, son aware of plan

## 2016-03-22 NOTE — Progress Notes (Signed)
PROGRESS NOTE                                                                                                                                                                                                             Patient Demographics:    Philip Beck, is a 70 y.o. male, DOB - February 08, 1946, [id number]  Admit date - 03/21/2016   Admitting Physician No admitting provider for patient encounter.  Outpatient Primary MD for the patient is Napoleon, DO  LOS - 0  Outpatient Specialists: Oncologist at [facility]  Chief Complaint  Patient presents with  . Shortness of Breath       Brief Narrative    Philip Beck is a 70 y.o. 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 He has had increased yellowish secretions from his trach. He uses NCO2 at night. He had hypoxia in the ED, and was treated with Nebulizer treatments and suctioning and had improvement. He was found to have an elevated Lactic acid level of 2.57, and findings of ATx vs Pneumonia on Chest X-ray. He was placed on IV Antibiotic Rx of Vancomycin and Cefepime and referred for admission.   Subjective:    Philip Beck today has, No headache, No chest pain, No abdominal pain - No Nausea, No new weakness tingling or numbness, ++ productiive Cough & mild SOB.    Assessment  & Plan :     1. Sepsis due to Nosocomial pneumonia with acute on chronic hypoxic respiratory failure in a patient with head and neck cancer status post tracheostomy. Seen by pulmonary critical care, we will continue empiric IV antibiotics, follow culture results, continue trach site care along with supportive treatment with oxygen and nebulizer treatments as  needed along with pulmonary toilet. Sepsis physiology has resolved.  2. Head and neck cancer. Status post recast her me, her oncologist is at [facility], talked with patient and son, he is not interested in any further chemotherapy or radiation treatments, he wants to pursue gentle medical treatment and hospice. We will consult palliative care.  3. Anemia of chronic disease. Stable.  4. History of  smoking and alcohol. Has quit both. Consult to continue to abstain.  5. PEG tube. Nothing by mouth, all medications and diet while PEG tube, nutritionist consulted. This is due to head and neck malignancy and tracheostomy.  6. History of cirrhosis. Most likely alcoholic. No acute issue.    Code Status : DNR  Family Communication  : Son  Disposition Plan  : TBD  Barriers For Discharge : Pall care/ Hospice arrangements  Consults  : PCCM, Pall Care  Procedures  :     DVT Prophylaxis  :  Lovenox   Lab Results  Component Value Date   PLT 248 03/22/2016    Antibiotics  :     Anti-infectives    Start     Dose/Rate Route Frequency Ordered Stop   03/22/16 2300  ceFEPIme (MAXIPIME) 2 g in dextrose 5 % 50 mL IVPB     2 g 100 mL/hr over 30 Minutes Intravenous Every 24 hours 03/21/16 2358     03/22/16 2200  vancomycin (VANCOCIN) IVPB 750 mg/150 ml premix     750 mg 150 mL/hr over 60 Minutes Intravenous Every 24 hours 03/21/16 2358     03/21/16 2300  ceFEPIme (MAXIPIME) 2 g in dextrose 5 % 50 mL IVPB     2 g 100 mL/hr over 30 Minutes Intravenous  Once 03/21/16 2247 03/22/16 0050   03/21/16 2300  vancomycin (VANCOCIN) IVPB 1000 mg/200 mL premix     1,000 mg 200 mL/hr over 60 Minutes Intravenous  Once 03/21/16 2247 03/22/16 0050        Objective:   Filed Vitals:   03/22/16 1100 03/22/16 1152 03/22/16 1200 03/22/16 1248  BP: 91/63  97/70   Pulse: 98 96 93   Temp:    98 F (36.7 C)  TempSrc:    Oral  Resp: 15 18 18    SpO2: 100% 100% 98%     Wt Readings from Last 3  Encounters:  03/16/16 51.256 kg (113 lb)  03/16/16 51.347 kg (113 lb 3.2 oz)     Intake/Output Summary (Last 24 hours) at 03/22/16 1314 Last data filed at 03/22/16 1312  Gross per 24 hour  Intake   2000 ml  Output   1025 ml  Net    975 ml     Physical Exam  Awake Alert, Oriented X 3, No new F.N deficits, Normal affect [place].AT,PERRAL, Trach site stable,  Supple Neck,No JVD, No cervical lymphadenopathy appriciated.  Symmetrical Chest wall movement, Good air movement bilaterally, Coarse B sounds RRR,No Gallops,Rubs or new Murmurs, No Parasternal Heave +ve B.Sounds, Abd Soft, No tenderness, No organomegaly appriciated, No rebound - guarding or rigidity. PEG in place No Cyanosis, Clubbing or edema, No new Rash or bruise      Data Review:    CBC  Recent Labs Lab 03/21/16 2302 03/22/16 0350  WBC 25.1* 20.6*  HGB 9.7* 8.2*  HCT 31.3* 26.2*  PLT 296 248  MCV 87.2 86.8  MCH 27.0 27.2  MCHC 31.0 31.3  RDW 15.4 15.7*  LYMPHSABS 1.0  --   MONOABS 2.3*  --   EOSABS 0.3  --   BASOSABS 0.0  --     Chemistries   Recent Labs Lab 03/21/16 2302 03/22/16 0350  NA 134* 133*  K 4.7 3.9  CL 96* 102  CO2 26 25  GLUCOSE 119* 129*  BUN 45* 35*  CREATININE 1.31* 1.06  CALCIUM 9.0 7.7*   ------------------------------------------------------------------------------------------------------------------ No results for input(s): CHOL, HDL, LDLCALC, TRIG,  CHOLHDL, LDLDIRECT in the last 72 hours.  No results found for: HGBA1C ------------------------------------------------------------------------------------------------------------------ No results for input(s): TSH, T4TOTAL, T3FREE, THYROIDAB in the last 72 hours.  Invalid input(s): FREET3 ------------------------------------------------------------------------------------------------------------------ No results for input(s): VITAMINB12, FOLATE, FERRITIN, TIBC, IRON, RETICCTPCT in the last 72 hours.  Coagulation  profile  Recent Labs Lab 03/21/16 2302 03/22/16 0350  INR 1.43 1.64*    No results for input(s): DDIMER in the last 72 hours.  Cardiac Enzymes No results for input(s): CKMB, TROPONINI, MYOGLOBIN in the last 168 hours.  Invalid input(s): CK ------------------------------------------------------------------------------------------------------------------ No results found for: BNP  Inpatient Medications  Scheduled Meds: . albuterol  2.5 mg Nebulization TID  . enoxaparin (LOVENOX) injection  40 mg Subcutaneous Q24H  . guaiFENesin  10 mL Oral Daily  . spironolactone  25 mg Oral BID  . torsemide  20 mg Oral Daily  . vancomycin  750 mg Intravenous Q24H   Continuous Infusions: . sodium chloride 50 mL/hr at 03/22/16 0611  . ceFEPime (MAXIPIME) IV     PRN Meds:.acetaminophen **OR** acetaminophen, albuterol, HYDROmorphone (DILAUDID) injection, ondansetron **OR** ondansetron (ZOFRAN) IV, oxyCODONE  Micro Results No results found for this or any previous visit (from the past 240 hour(s)).  Radiology Reports Dg Abd 1 View  03/16/2016  CLINICAL DATA:  Leaking percutaneous gastrostomy tube at the skin site after tube down sized at outside institution. EXAM: ABDOMEN - 1 VIEW COMPARISON:  None. FINDINGS: Percutaneous gastrostomy tube in good position with inflated retention balloon overlapping the distal gastric lumen. Contrast was injected, opacifying the distal stomach and duodenal C-loop. Calcification along the pancreatic bed consistent with chronic pancreatitis. No peritoneal leakage is seen. Some skin leakage is seen around the retention disc on the second image. There is no gastric outlet obstruction including by the balloon. No evidence of catheter leakage. Reticular markings at the right base, likely chronic. IMPRESSION: 1. Located percutaneous gastrostomy tube. No gastric outlet obstruction or catheter leakage. 2. Calcified chronic pancreatitis. Electronically Signed   By: Monte Fantasia M.D.   On: 03/16/2016 12:01   Dg Chest Port 1 View  03/22/2016  CLINICAL DATA:  Sepsis and shortness of breath tonight. EXAM: PORTABLE CHEST 1 VIEW COMPARISON:  03/21/2016 FINDINGS: Shallow inspiration with atelectasis or infiltration in the lung bases. No change since prior study. Heart size and pulmonary vascularity are normal. Mediastinal contours appear intact. Tracheostomy. Old right clavicular fracture. No blunting of costophrenic angles. No pneumothorax. IMPRESSION: Shallow inspiration with atelectasis or infiltration in the lung bases. Electronically Signed   By: Lucienne Capers M.D.   On: 03/22/2016 04:45   Dg Chest Port 1 View  03/21/2016  CLINICAL DATA:  Shortness of breath, trach EXAM: PORTABLE CHEST 1 VIEW COMPARISON:  03/08/2016 FINDINGS: Mild bibasilar opacities, atelectasis versus pneumonia. No pleural effusion or pneumothorax. Tracheostomy in satisfactory position. Heart is normal in size. IMPRESSION: Mild bibasilar opacities, atelectasis versus pneumonia. Electronically Signed   By: Julian Hy M.D.   On: 03/21/2016 22:20    Time Spent in minutes  25   Tiffany Calmes K M.D on 03/22/2016 at 1:14 PM  Between 7am to 7pm - Pager - [phone]  After 7pm go to [email or web address] - password [facility]  [facility] -  Office  [phone]

## 2016-03-22 NOTE — H&P (Addendum)
[facility] Admission History and Physical       Philip Beck [id number] DOB: Apr 03, 1946 DOA: 03/21/2016  Referring physician: EDP PCP: Octavio Graves, DO  Specialists:   Chief Complaint: SOB and Cough  HPI: Philip Beck is a 70 y.o. male with a history of Chronic Respiratory failure, S/P Tracheostomy, Cirrhosis who presents to the ED with complaints of increased SOB, and worsening Cough x 2 days.    He has had increased yellowish secretions from his trach.    He uses NCO2 at night.   He had hypoxia in the ED, and was treated with Nebulizer treatments and suctioning and had improvement.   He was found to have an elevated Lactic acid level of 2.57, and findings of ATx vs Pneumonia on Chest X-ray.  He was placed on IV Antibiotic Rx of Vancomycin and Cefepime and referred for admission.      Review of Systems:    Constitutional: No Weight Loss, No Weight Gain, Night Sweats, Fevers, Chills, Dizziness, Light Headedness, Fatigue, or Generalized Weakness HEENT: No Headaches, Difficulty Swallowing,Tooth/Dental Problems,Sore Throat,  No Sneezing, Rhinitis, Ear Ache, Nasal Congestion, or Post Nasal Drip,  Cardio-vascular:  No Chest pain, Orthopnea, PND, Edema in Lower Extremities, Anasarca, Dizziness, Palpitations  Resp: +Dyspnea, No DOE, +Productive Cough, No Non-Productive Cough, No Hemoptysis, No Wheezing.    GI: No Heartburn, Indigestion, Abdominal Pain, Nausea, Vomiting, Diarrhea, Constipation, Hematemesis, Hematochezia, Melena, Change in Bowel Habits,  Loss of Appetite  GU: No Dysuria, No Change in Color of Urine, No Urgency or Urinary Frequency, No Flank pain.  Musculoskeletal: No Joint Pain or Swelling, No Decreased Range of Motion, No Back Pain.  Neurologic: No Syncope, No Seizures, Muscle Weakness, Paresthesia, Vision Disturbance or Loss, No Diplopia, No Vertigo, No Difficulty Walking,  Skin: No Rash or Lesions. Psych: No Change in Mood or Affect, No Depression or  Anxiety, No Memory loss, No Confusion, or Hallucinations   Past Medical History  Diagnosis Date  . Dysphagia   . Head and neck cancer ([place])   . Depression   . GERD (gastroesophageal reflux disease)   . Corneal ulcer   . DDD (degenerative disc disease), cervical   . Cataract   . Cirrhosis ([place])   . Hearing loss   . Allergic rhinitis   . Seasonal allergies      Past Surgical History  Procedure Laterality Date  . Tracheostomy    . Peg placement    . Eye surgery Bilateral     cataract removeal  . Tonsillectomy    . Esophagogastroduodenoscopy    . Laryngoscopy    . Peg placement N/A 03/16/2016    Procedure: PERCUTANEOUS ENDOSCOPIC GASTROSTOMY (PEG) REPLACEMENT;  Surgeon: Danie Binder, MD;  Location: [facility];  Service: Endoscopy;  Laterality: N/A;  1245      Prior to Admission medications   Medication Sig Start Date End Date Taking? Authorizing Provider  azithromycin (ZITHROMAX) 500 MG tablet Take 1,000 mg by mouth 2 (two) times daily.   Yes Historical Provider, MD  clotrimazole (LOTRIMIN) 1 % cream Apply 1 application topically 2 (two) times daily. FOR 7 DAYS 03/16/16  Yes Danie Binder, MD  guaiFENesin (ROBITUSSIN) 100 MG/5ML liquid Take 200 mg by mouth daily. Take one every day   Yes Historical Provider, MD  oxyCODONE-acetaminophen (PERCOCET) 10-325 MG tablet Take 1 tablet by mouth every 4 (four) hours as needed for pain.   Yes Historical Provider, MD  ranitidine (ZANTAC) 150 MG capsule Take 150  mg by mouth 2 (two) times daily.   Yes Historical Provider, MD  silver sulfADIAZINE (SILVADENE) 1 % cream Apply 1 application topically daily as needed. For rash/burn   Yes Historical Provider, MD  spironolactone (ALDACTONE) 25 MG tablet Take 25 mg by mouth 2 (two) times daily.   Yes Historical Provider, MD  torsemide (DEMADEX) 20 MG tablet Take 20 mg by mouth daily.   Yes Historical Provider, MD     Allergies  Allergen Reactions  . Penicillins Itching    Has patient had a  PCN reaction causing immediate rash, facial/tongue/throat swelling, SOB or lightheadedness with hypotension:YES Has patient had a PCN reaction causing severe rash involving mucus membranes or skin necrosis: NO Has patient had a PCN reaction that required hospitalization NO Has patient had a PCN reaction occurring within the last 10 years: NO If all of the above answers are "NO", then may proceed with Cephalosporin use.    Social History:  reports that he has been smoking Cigarettes.  He has been smoking about 0.25 packs per day. He has never used smokeless tobacco. He reports that he does not drink alcohol or use illicit drugs.    Family History  Problem Relation Age of Onset  . Cancer Mother   . Stroke Father        Physical Exam:  GEN:  Pleasant Obese 70 y.o. Caucasian male examined and in no acute distress; cooperative with exam Filed Vitals:   03/21/16 2144 03/21/16 2300 03/22/16 0030 03/22/16 0055  BP: 97/61  107/68   Pulse: 100 103 98   Temp: 99.1 F (37.3 C)   97.7 F (36.5 C)  TempSrc: Oral   Oral  Resp: 26 30 14    SpO2: 100% 100% 100%    Blood pressure 107/68, pulse 98, temperature 97.7 F (36.5 C), temperature source Oral, resp. rate 14, SpO2 100 %. PSYCH: He is alert and oriented x4; does not appear anxious does not appear depressed; affect is normal HEENT: Normocephalic and Atraumatic, Mucous membranes pink; PERRLA; EOM intact; Fundi:  Benign;  No scleral icterus, Nares: Patent, Oropharynx: Clear, Fair Dentition,    Neck:  + Tracheostomy present,  FROM, No Cervical Lymphadenopathy nor Thyromegaly or Carotid Bruit; No JVD; Breasts:: Not examined CHEST WALL: No tenderness CHEST: Normal respiration, clear to auscultation bilaterally HEART: Regular rate and rhythm; no murmurs rubs or gallops BACK: No kyphosis or scoliosis; No CVA tenderness ABDOMEN: Positive Bowel Sounds, Obese, Soft Non-Tender, No Rebound or Guarding; No Masses, No Organomegaly. Rectal Exam: Not  done EXTREMITIES: No Cyanosis, Clubbing, 2+ BLE Edema; No Ulcerations. Genitalia: not examined PULSES: 2+ and symmetric SKIN: Normal hydration no rash or ulceration CNS:  Alert and Oriented x 4, No Focal Deficits Vascular: pulses palpable throughout    Labs on Admission:  Basic Metabolic Panel:  Recent Labs Lab 03/21/16 2302  NA 134*  K 4.7  CL 96*  CO2 26  GLUCOSE 119*  BUN 45*  CREATININE 1.31*  CALCIUM 9.0   Liver Function Tests: No results for input(s): AST, ALT, ALKPHOS, BILITOT, PROT, ALBUMIN in the last 168 hours. No results for input(s): LIPASE, AMYLASE in the last 168 hours. No results for input(s): AMMONIA in the last 168 hours. CBC:  Recent Labs Lab 03/21/16 2302  WBC 25.1*  NEUTROABS 21.5*  HGB 9.7*  HCT 31.3*  MCV 87.2  PLT 296   Cardiac Enzymes: No results for input(s): CKTOTAL, CKMB, CKMBINDEX, TROPONINI in the last 168 hours.  BNP (last 3 results)  No results for input(s): BNP in the last 8760 hours.  ProBNP (last 3 results) No results for input(s): PROBNP in the last 8760 hours.  CBG: No results for input(s): GLUCAP in the last 168 hours.  Radiological Exams on Admission: Dg Chest Port 1 View  03/21/2016  CLINICAL DATA:  Shortness of breath, trach EXAM: PORTABLE CHEST 1 VIEW COMPARISON:  03/08/2016 FINDINGS: Mild bibasilar opacities, atelectasis versus pneumonia. No pleural effusion or pneumothorax. Tracheostomy in satisfactory position. Heart is normal in size. IMPRESSION: Mild bibasilar opacities, atelectasis versus pneumonia. Electronically Signed   By: Julian Hy M.D.   On: 03/21/2016 22:20    EKG: Independently reviewed.        Assessment/Plan:     70 y.o. male with  Principal Problem:    HCAP (healthcare-associated pneumonia)   IV Vancomycin and Cefepime   Albuterol Nebs   NCO2 PRN       Active Problems:   Sepsis   Sepsis Protocol        Head and neck cancer ([place]) Hx/ S/P Tracheostomy in place [facility])   [place] PRN      Leukocytosis- due to HCAP   Monitor Trend     Anemia   Monitor Trend     Cirrhosis ([place])   Hx       DVT Prophylaxis   Lovenox    Code Status:   DO NOT RESUSCITATE (DNR)      Family Communication:   No Family Present    Disposition Plan:    Inpatient Status        Time spent:   51 Minutes      Theressa Millard [facility] Pager [phone]   If [place] Please Contact the Day Rounding Team MD for [facility]  If 7PM-7AM, Please Contact Night-Floor Coverage  [email or web address] Password [email or web address] 03/22/2016, 1:20 AM     ADDENDUM:   Patient was seen and examined on 03/22/2016

## 2016-03-22 NOTE — Consult Note (Signed)
PULMONARY / CRITICAL CARE MEDICINE   Name: Philip Beck MRN: [id number] DOB: 05/03/1946    ADMISSION DATE:  03/21/2016 CONSULTATION DATE:  03/22/2016  REFERRING MD:  Dr. Arnoldo Morale with the hospitalist service  CHIEF COMPLAINT:  Assessment for tracheostomy change  HISTORY OF PRESENT ILLNESS:   This is a 70 year old male with a past medical history significant for head and neck cancer who has a tracheostomy in place since July 2016 who was admitted to our facility today in the setting of a diagnosis of healthcare associated pneumonia. He recently had a PEG tube placed and during that procedure his tracheostomy tube was changed to a #4 coughed trach. That tube was left in place. He was admitted today with shortness of breath cough, and thick secretions. Pulmonary and critical care medicine was consulted to consider a tracheostomy change.  PAST MEDICAL HISTORY :  He  has a past medical history of Dysphagia; Head and neck cancer ([place]); Depression; GERD (gastroesophageal reflux disease); Corneal ulcer; DDD (degenerative disc disease), cervical; Cataract; Cirrhosis ([place]); Hearing loss; Allergic rhinitis; and Seasonal allergies.  PAST SURGICAL HISTORY: He  has past surgical history that includes Tracheostomy; PEG placement; Eye surgery (Bilateral); Tonsillectomy; Esophagogastroduodenoscopy; Laryngoscopy; and PEG placement (N/A, 03/16/2016).  Allergies  Allergen Reactions  . Penicillins Itching    Has patient had a PCN reaction causing immediate rash, facial/tongue/throat swelling, SOB or lightheadedness with hypotension:YES Has patient had a PCN reaction causing severe rash involving mucus membranes or skin necrosis: NO Has patient had a PCN reaction that required hospitalization NO Has patient had a PCN reaction occurring within the last 10 years: NO If all of the above answers are "NO", then may proceed with Cephalosporin use.    No current facility-administered medications on file prior to  encounter.   Current Outpatient Prescriptions on File Prior to Encounter  Medication Sig  . azithromycin (ZITHROMAX) 500 MG tablet Take 1,000 mg by mouth 2 (two) times daily.  . clotrimazole (LOTRIMIN) 1 % cream Apply 1 application topically 2 (two) times daily. FOR 7 DAYS  . oxyCODONE-acetaminophen (PERCOCET) 10-325 MG tablet Take 1 tablet by mouth every 4 (four) hours as needed for pain.  . ranitidine (ZANTAC) 150 MG capsule Take 150 mg by mouth 2 (two) times daily.  Marland Kitchen spironolactone (ALDACTONE) 25 MG tablet Take 25 mg by mouth 2 (two) times daily.  Marland Kitchen torsemide (DEMADEX) 20 MG tablet Take 20 mg by mouth daily.    FAMILY HISTORY:  His has no family status information on file.   SOCIAL HISTORY: He  reports that he has been smoking Cigarettes.  He has been smoking about 0.25 packs per day. He has never used smokeless tobacco. He reports that he does not drink alcohol or use illicit drugs.  REVIEW OF SYSTEMS:   Gen: Denies fever, chills, weight change, + fatigue, night sweats HEENT: Denies blurred vision, double vision, hearing loss, tinnitus, sinus congestion, rhinorrhea, sore throat, neck stiffness, dysphagia PULM: per HPI CV: Denies chest pain, edema, orthopnea, paroxysmal nocturnal dyspnea, palpitations GI: Denies abdominal pain, nausea, vomiting, diarrhea, hematochezia, melena, constipation, change in bowel habits GU: Denies dysuria, hematuria, polyuria, oliguria, urethral discharge Endocrine: Denies hot or cold intolerance, polyuria, polyphagia or appetite change Derm: Denies rash, dry skin, scaling or peeling skin change Heme: Denies easy bruising, bleeding, bleeding gums Neuro: Denies headache, numbness, weakness, slurred speech, loss of memory or consciousness   SUBJECTIVE:  As above  VITAL SIGNS: BP 93/60 mmHg  Pulse 98  Temp(Src) 97.7 F (  36.5 C) (Oral)  Resp 25  SpO2 98%  HEMODYNAMICS:    VENTILATOR SETTINGS: Vent Mode:  [-]  FiO2 (%):  [21 %] 21 %  INTAKE /  OUTPUT: I/O last 3 completed shifts: In: 2000 [I.V.:2000] Out: 575 [Urine:575]  PHYSICAL EXAMINATION: General:  Chronically ill-appearing, no distress Neuro:  Awake, alert, oriented 4, moves all 4 extremities HEENT:  Normocephalic atraumatic, tracheostomy in place with thick brown secretions Cardiovascular:  Regular rate and rhythm, no murmurs gallops or rubs Lungs:  Some rhonchi bilaterally, crackles right base, normal respiratory effort Abdomen:  Bowel sounds positive, nontender nondistended, PEG tube in place Musculoskeletal:  Normal bulk and tone Skin:  No rash  LABS:  BMET  Recent Labs Lab 03/21/16 2302 03/22/16 0350  NA 134* 133*  K 4.7 3.9  CL 96* 102  CO2 26 25  BUN 45* 35*  CREATININE 1.31* 1.06  GLUCOSE 119* 129*    Electrolytes  Recent Labs Lab 03/21/16 2302 03/22/16 0350  CALCIUM 9.0 7.7*    CBC  Recent Labs Lab 03/21/16 2302 03/22/16 0350  WBC 25.1* 20.6*  HGB 9.7* 8.2*  HCT 31.3* 26.2*  PLT 296 248    Coag's  Recent Labs Lab 03/21/16 2302 03/22/16 0350  APTT  --  34  INR 1.43 1.64*    Sepsis Markers  Recent Labs Lab 03/21/16 2323 03/22/16 0201 03/22/16 0350  LATICACIDVEN 2.57* 0.80 1.1  PROCALCITON  --   --  <0.10    ABG No results for input(s): PHART, PCO2ART, PO2ART in the last 168 hours.  Liver Enzymes No results for input(s): AST, ALT, ALKPHOS, BILITOT, ALBUMIN in the last 168 hours.  Cardiac Enzymes No results for input(s): TROPONINI, PROBNP in the last 168 hours.  Glucose  Recent Labs Lab 03/22/16 0434  GLUCAP 128*    Imaging Dg Chest Port 1 View  03/22/2016  CLINICAL DATA:  Sepsis and shortness of breath tonight. EXAM: PORTABLE CHEST 1 VIEW COMPARISON:  03/21/2016 FINDINGS: Shallow inspiration with atelectasis or infiltration in the lung bases. No change since prior study. Heart size and pulmonary vascularity are normal. Mediastinal contours appear intact. Tracheostomy. Old right clavicular fracture. No  blunting of costophrenic angles. No pneumothorax. IMPRESSION: Shallow inspiration with atelectasis or infiltration in the lung bases. Electronically Signed   By: Lucienne Capers M.D.   On: 03/22/2016 04:45   Dg Chest Port 1 View  03/21/2016  CLINICAL DATA:  Shortness of breath, trach EXAM: PORTABLE CHEST 1 VIEW COMPARISON:  03/08/2016 FINDINGS: Mild bibasilar opacities, atelectasis versus pneumonia. No pleural effusion or pneumothorax. Tracheostomy in satisfactory position. Heart is normal in size. IMPRESSION: Mild bibasilar opacities, atelectasis versus pneumonia. Electronically Signed   By: Julian Hy M.D.   On: 03/21/2016 22:20   Images personally reviewed and I concur with the findings from the radiologist  STUDIES:    CULTURES: Blood culture April 17 Sputum culture April 17  ANTIBIOTICS: Vancomycin April 17 Cefepime April 17 Azithromycin April 17  SIGNIFICANT EVENTS: 03/21/2016 admission  LINES/TUBES: Tracheostomy tube placed July 2016 at [place]: This is a 70 year old male being admitted to the hospital for healthcare associated pneumonia. He has a small tracheostomy in place with thick brown secretions. I have recommended that the tube be upsized. However, in my conversation with the patient he is unwilling to undergo this procedure at this time.  ASSESSMENT / PLAN:  PULMONARY A: Healthcare associated pneumonia Tracheostomy status P:   Continue antibiotics as you're doing Recommend Mucinex twice  a day I have recommended to the patient that he have his tracheostomy upsized. I spent a significant amount of time trying to explain this to him. However, at this time he is unwilling to do that and says that he just wants to be moved out of the emergency room. I explained to him that after we admitted him to the hospital I think he needs to have his tracheostomy upsized. His words to me were "we will talk" but he was unwilling to undergo this procedure  right now.  Please call us back if the patient has decided to have his tracheostomy upsized, this will likely require dilatation.   Roselie Awkward, MD [place] [facility] Pager: [phone] Cell: [phone] After 3pm or if no response, call [phone]  03/22/2016, 8:39 AM

## 2016-03-23 ENCOUNTER — Encounter (HOSPITAL_COMMUNITY): Payer: Self-pay | Admitting: Primary Care

## 2016-03-23 DIAGNOSIS — Z515 Encounter for palliative care: Secondary | ICD-10-CM | POA: Insufficient documentation

## 2016-03-23 DIAGNOSIS — A419 Sepsis, unspecified organism: Principal | ICD-10-CM

## 2016-03-23 LAB — URINE CULTURE

## 2016-03-23 LAB — CBC
HCT: 27.2 % — ABNORMAL LOW (ref 39.0–52.0)
Hemoglobin: 8.6 g/dL — ABNORMAL LOW (ref 13.0–17.0)
MCH: 27.3 pg (ref 26.0–34.0)
MCHC: 31.6 g/dL (ref 30.0–36.0)
MCV: 86.3 fL (ref 78.0–100.0)
PLATELETS: 249 10*3/uL (ref 150–400)
RBC: 3.15 MIL/uL — AB (ref 4.22–5.81)
RDW: 15.6 % — AB (ref 11.5–15.5)
WBC: 15.8 10*3/uL — ABNORMAL HIGH (ref 4.0–10.5)

## 2016-03-23 LAB — BASIC METABOLIC PANEL
Anion gap: 9 (ref 5–15)
BUN: 25 mg/dL — AB (ref 6–20)
CO2: 25 mmol/L (ref 22–32)
CREATININE: 0.95 mg/dL (ref 0.61–1.24)
Calcium: 8.4 mg/dL — ABNORMAL LOW (ref 8.9–10.3)
Chloride: 101 mmol/L (ref 101–111)
GFR calc Af Amer: >60 mL/min (ref >60)
Glucose, Bld: 103 mg/dL — ABNORMAL HIGH (ref 65–99)
Potassium: 3.4 mmol/L — ABNORMAL LOW (ref 3.5–5.1)
Sodium: 135 mmol/L (ref 135–145)

## 2016-03-23 LAB — GLUCOSE, CAPILLARY: Glucose-Capillary: 190 mg/dL — ABNORMAL HIGH (ref 65–99)

## 2016-03-23 MED ORDER — POTASSIUM CHLORIDE CRYS ER 20 MEQ PO TBCR
40.0000 meq | EXTENDED_RELEASE_TABLET | Freq: Once | ORAL | Status: AC
  Administered 2016-03-23: 40 meq via ORAL
  Filled 2016-03-23: qty 2

## 2016-03-23 MED ORDER — POTASSIUM CHLORIDE CRYS ER 20 MEQ PO TBCR
40.0000 meq | EXTENDED_RELEASE_TABLET | ORAL | Status: AC
  Administered 2016-03-23: 40 meq via ORAL
  Filled 2016-03-23: qty 2

## 2016-03-23 MED ORDER — JEVITY 1.2 CAL PO LIQD
237.0000 mL | Freq: Every day | ORAL | Status: DC
  Administered 2016-03-23 – 2016-03-27 (×22): 237 mL
  Filled 2016-03-23 (×33): qty 237

## 2016-03-23 NOTE — Progress Notes (Signed)
CPT not done at this time, patient sleeping.

## 2016-03-23 NOTE — Clinical Documentation Improvement (Signed)
Internal Medicine Pulmonology  Can the diagnosis of pneumonia be further specified? Patient with trach, cough, thick mucous, possible plugging (ED note); frequent suctioning. Please document findings in next progress note. Thank you!   Type of Pneumonia - CAP, HCAP, Bacterial Pneumonia, Aspiration Pneumonia, Gram Negative Pneumonia, Other Condition, Unable to Clinically Determine  Other condition  Clinically Undetermined  Supporting Information: :   As above  Please exercise your independent, professional judgment when responding. A specific answer is not anticipated or expected.  Thank You, Zoila Shutter RN, BSN, [place] [phone]; Cell: [phone]

## 2016-03-23 NOTE — Consult Note (Signed)
Consultation Note Date: 03/23/2016   Patient Name: Philip Beck  DOB: 02/22/1946  MRN: [id number]  Age / Sex: 70 y.o., male  PCP: Octavio Graves, DO Referring Physician: Thurnell Lose, MD  Reason for Consultation: Disposition, Establishing goals of care, Inpatient hospice referral and Psychosocial/spiritual support    Clinical Assessment/Narrative: Mr. Philip Beck is a 70 yo male with history of head and neck cancer, chronic resp failure, sp trach, and cirrhosis.  He came to ED with SOB, and found to have PNE.   Mr. [name] is resting quietly in bed,  He shares that his son, [name], is HCPOA.  He denies pain, but signs that he "doesnt feel like himself".  He shares that he is unable to care for himself at home.   Call to son, [name].  We meet at bedside.  [name] shares that his father has been with [facility] with services in the home.  We discuss continuing IV antibiotics at the hospital and then transferring to [facility] when they will accept him.  Colwyn shares that Hospice was covering the costs of antibiotics per PEG, and tube feeding (with cans).  Edem shares that they were told 6-8 months in February 2017.  He states his father has had weigh loss over the last few months.  He shares that his father had asked that he not be sent to a nursing home, and Jaylon is reassured that he has taken good care of his father.  We discuss what end of life will look like, sleeping more, interacting less, and to lean on Hospice for support.   Levander shares that his main worry at this time is nutrition. I share that RD visited and now has orders for tube feeding.   Contacts/Participants in Discussion: Mr. Fochs and son Rahman.  Primary Decision Maker:  Mr. [name] and son Radeen.   Relationship to Patient son HCPOA: yes  son [name], 4 other brothers, but [name] is oldest.   SUMMARY  OF RECOMMENDATIONS  Code Status/Advance Care Planning: DNR    Code Status Orders        Start     Ordered   03/22/16 0201  Do not attempt resuscitation (DNR)   Continuous    Question Answer Comment  In the event of cardiac or respiratory ARREST Do not call a "code blue"   In the event of cardiac or respiratory ARREST Do not perform Intubation, CPR, defibrillation or ACLS   In the event of cardiac or respiratory ARREST Use medication by any route, position, wound care, and other measures to relive pain and suffering. May use oxygen, suction and manual treatment of airway obstruction as needed for comfort.      03/22/16 0200    Code Status History    Date Active Date Inactive Code Status Order ID Comments User Context   03/21/2016 11:15 PM 03/22/2016  2:00 AM DNR [id number]  Varney Biles, MD ED    Advance Directive Documentation        Most Recent Value   Type of Advance Directive  Living will   Pre-existing out of facility DNR order (yellow form or pink MOST form)     "MOST" Form in Place?        Other Directives:None  Symptom Management:   Per hospitalist   Palliative Prophylaxis:   Aspiration, Frequent Pain Assessment and Turn Reposition  Additional Recommendations (Limitations, Scope, Preferences):  Treat the treatable.   Psycho-social/Spiritual:  Support  System: Strong Desire for further Chaplaincy support:no Additional Recommendations: Caregiving  Support/Resources and Grief/Bereavement Support  Prognosis: Unable to determine, based on outcomes, but likely 4 weeks or less.   Discharge Planning: Hospice facility, [facility] home.    Chief Complaint/ Primary Diagnoses: Present on Admission:  . HCAP (healthcare-associated pneumonia) . Head and neck cancer ([place]) . Leukocytosis . Cirrhosis ([place]) . Anemia . Sepsis ([place])  I have reviewed the medical record, interviewed the patient and family, and examined the patient. The following aspects  are pertinent.  Past Medical History  Diagnosis Date  . Dysphagia   . Head and neck cancer ([place])   . Depression   . GERD (gastroesophageal reflux disease)   . Corneal ulcer   . DDD (degenerative disc disease), cervical   . Cataract   . Cirrhosis ([place])   . Hearing loss   . Allergic rhinitis   . Seasonal allergies    Social History   Social History  . Marital Status: Single    Spouse Name: N/A  . Number of Children: N/A  . Years of Education: N/A   Social History Main Topics  . Smoking status: Current Some Day Smoker -- 0.25 packs/day    Types: Cigarettes  . Smokeless tobacco: Never Used  . Alcohol Use: No  . Drug Use: No  . Sexual Activity: Not Asked   Other Topics Concern  . None   Social History Narrative   Family History  Problem Relation Age of Onset  . Cancer Mother   . Stroke Father    Scheduled Meds: . ceFEPime (MAXIPIME) IV  2 g Intravenous Q24H  . enoxaparin (LOVENOX) injection  40 mg Subcutaneous Q24H  . feeding supplement (JEVITY 1.2 CAL)  237 mL Per Tube 6 X Daily  . guaiFENesin  10 mL Oral Daily  . potassium chloride  40 mEq Oral Q4H  . spironolactone  25 mg Oral BID  . torsemide  20 mg Oral Daily  . vancomycin  750 mg Intravenous Q24H   Continuous Infusions:  PRN Meds:.acetaminophen **OR** acetaminophen, albuterol, HYDROmorphone (DILAUDID) injection, [DISCONTINUED] ondansetron **OR** ondansetron (ZOFRAN) IV, oxyCODONE Medications Prior to Admission:  Prior to Admission medications   Medication Sig Start Date End Date Taking? Authorizing Provider  azithromycin (ZITHROMAX) 500 MG tablet Take 1,000 mg by mouth 2 (two) times daily.   Yes Historical Provider, MD  clotrimazole (LOTRIMIN) 1 % cream Apply 1 application topically 2 (two) times daily. FOR 7 DAYS 03/16/16  Yes Danie Binder, MD  guaiFENesin (ROBITUSSIN) 100 MG/5ML liquid Take 200 mg by mouth daily. Take one every day   Yes Historical Provider, MD  oxyCODONE-acetaminophen (PERCOCET) 10-325  MG tablet Take 1 tablet by mouth every 4 (four) hours as needed for pain.   Yes Historical Provider, MD  ranitidine (ZANTAC) 150 MG capsule Take 150 mg by mouth 2 (two) times daily.   Yes Historical Provider, MD  silver sulfADIAZINE (SILVADENE) 1 % cream Apply 1 application topically daily as needed. For rash/burn   Yes Historical Provider, MD  spironolactone (ALDACTONE) 25 MG tablet Take 25 mg by mouth 2 (two) times daily.   Yes Historical Provider, MD  torsemide (DEMADEX) 20 MG tablet Take 20 mg by mouth daily.   Yes Historical Provider, MD   Allergies  Allergen Reactions  . Penicillins Itching    Has patient had a PCN reaction causing immediate rash, facial/tongue/throat swelling, SOB or lightheadedness with hypotension:YES Has patient had a PCN reaction causing severe rash involving  mucus membranes or skin necrosis: NO Has patient had a PCN reaction that required hospitalization NO Has patient had a PCN reaction occurring within the last 10 years: NO If all of the above answers are "NO", then may proceed with Cephalosporin use.    Review of Systems  Unable to perform ROS: Patient nonverbal    Physical Exam  Nursing note and vitals reviewed. Constitutional: No distress.  HENT:  Head: Normocephalic and atraumatic.  Cardiovascular: Normal rate and regular rhythm.   Respiratory: No respiratory distress.  Copious secretions, clearing.   GI: Soft. He exhibits no distension. There is no guarding.  Neurological: He is alert.  Skin: Skin is warm and dry.    Vital Signs: BP 95/64 mmHg  Pulse 101  Temp(Src) 98.5 F (36.9 C) (Oral)  Resp 20  Ht 5\' 5"  (1.651 m)  Wt 51.256 kg (113 lb)  BMI 18.80 kg/m2  SpO2 99%  SpO2: SpO2: 99 % O2 Device:SpO2: 99 % O2 Flow Rate: .O2 Flow Rate (L/min): 5 L/min  IO: Intake/output summary:  Intake/Output Summary (Last 24 hours) at 03/23/16 1502 Last data filed at 03/23/16 1401  Gross per 24 hour  Intake 1208.82 ml  Output   1525 ml  Net  -316.18 ml    LBM:   Baseline Weight: Weight: 51.256 kg (113 lb) Most recent weight: Weight: 51.256 kg (113 lb)      Palliative Assessment/Data:  Flowsheet Rows        Most Recent Value   Intake Tab    Referral Department  Hospitalist   Palliative Care Primary Diagnosis  Cancer   Date Notified  03/22/16   Palliative Care Type  New Palliative care   Reason for referral  Clarify Goals of Care, End of Life Care Assistance   Date of Admission  03/21/16   Date first seen by Palliative Care  03/23/16   # of days Palliative referral response time  1 Day(s)   # of days IP prior to Palliative referral  1   Clinical Assessment    Palliative Performance Scale Score  30%   Pain Max last 24 hours  Not able to report   Pain Min Last 24 hours  Not able to report   Dyspnea Max Last 24 Hours  Not able to report   Dyspnea Min Last 24 hours  Not able to report   Psychosocial & Spiritual Assessment    Palliative Care Outcomes    Patient/Family meeting held?  Yes   Who was at the meeting?  son/HCPOA Ileene Rubens.    Palliative Care Outcomes  Clarified goals of care, Provided psychosocial or spiritual support   Patient/Family wishes: Interventions discontinued/not started   Mechanical Ventilation   Palliative Care follow-up planned  -- [follow up while in patient]      Additional Data Reviewed:  CBC:    Component Value Date/Time   WBC 15.8* 03/23/2016 0452   HGB 8.6* 03/23/2016 0452   HCT 27.2* 03/23/2016 0452   PLT 249 03/23/2016 0452   MCV 86.3 03/23/2016 0452   NEUTROABS 21.5* 03/21/2016 2302   LYMPHSABS 1.0 03/21/2016 2302   MONOABS 2.3* 03/21/2016 2302   EOSABS 0.3 03/21/2016 2302   BASOSABS 0.0 03/21/2016 2302   Comprehensive Metabolic Panel:    Component Value Date/Time   NA 135 03/23/2016 0452   K 3.4* 03/23/2016 0452   CL 101 03/23/2016 0452   CO2 25 03/23/2016 0452   BUN 25* 03/23/2016 0452   CREATININE 0.95  03/23/2016 0452   GLUCOSE 103* 03/23/2016 0452    CALCIUM 8.4* 03/23/2016 0452     Time In: 1105 Time Out: 1205 Time Total: 60 minutes Greater than 50%  of this time was spent counseling and coordinating care related to the above assessment and plan.  Signed by: Drue Novel, NP  Drue Novel, NP  03/23/2016, 3:02 PM  Please contact Palliative Medicine Team phone at [phone] for questions and concerns.

## 2016-03-23 NOTE — Progress Notes (Addendum)
Initial Nutrition Assessment  DOCUMENTATION CODES:   Severe malnutrition in context of chronic illness  INTERVENTION:     Bolus TF regimen: Jevity 1.2 formula 1 can (237 ml) 6 times per day via PEG tube  Above TF regimen to provide 1728 kcals, 80 gm protein, 1158 ml of free water  NUTRITION DIAGNOSIS:   Inadequate oral intake related to inability to eat as evidenced by NPO status  GOAL:   Patient will meet greater than or equal to 90% of their needs  MONITOR:   TF tolerance, Labs, Weight trends, Skin, I & O's  REASON FOR ASSESSMENT:   Consult Enteral/tube feeding initiation and management  ASSESSMENT:   70 y.o. Male with a history of Head and neck malignancy treatment at [facility], status post tracheostomy and PEG tube placement, also has underlying cirrhosis likely alcoholic who presents to the ED with complaints of increased SOB, and worsening Cough x 2 days. He has had increased yellowish secretions from his trach. He uses NCO2 at night. He had hypoxia in the ED, and was treated with Nebulizer treatments and suctioning and had improvement. He was found to have an elevated Lactic acid level of 2.57, and findings of ATx vs Pneumonia on Chest X-ray. He was placed on IV Antibiotic Rx of Vancomycin and Cefepime and referred for admission.  Pt sleeping; on trach collar. Pt takes nothing by mouth; followed by [facility] with patient's son via telephone; pt receives bolus TF regimen of TwoCal HN formula - 6 can per day via PEG tube (2850 kcals, 119 gm protein). Pt's son stated he brought pt's formula in from home, however, RD did not see it in pt's room.  Limited nutrition-focused physical exam completed. Findings are severe fat depletion, severe muscle depletion, and no edema.   Diet Order:  Diet NPO time specified  Skin:  Wound (see comment) (Stage II to buttocks)  Last BM:  N/A  Height:   Ht Readings from Last 1 Encounters:   03/23/16 5\' 5"  (1.651 m)    Weight:   Wt Readings from Last 1 Encounters:  03/23/16 113 lb (51.256 kg)    Ideal Body Weight:  62 kg  BMI:  Body mass index is 18.8 kg/(m^2).  Estimated Nutritional Needs:   Kcal:  1600-1800  Protein:  80-90 gm  Fluid:  1.6-1.8 L  EDUCATION NEEDS:   No education needs identified at this time  Arthur Holms, RD, LDN Pager #: [phone] After-Hours Pager #: [phone]

## 2016-03-23 NOTE — Progress Notes (Signed)
PROGRESS NOTE                                                                                                                                                                                                             Patient Demographics:    Philip Beck, is a 70 y.o. male, DOB - [birth year], [id number]  Admit date - 03/21/2016   Admitting Physician Theressa Millard, MD  Outpatient Primary MD for the patient is [place], DO  LOS - 1  Outpatient Specialists: Oncologist at [facility]  Chief Complaint  Patient presents with  . Shortness of Breath       Brief Narrative    Philip Beck is a 70 y.o. male with a history of Head and neck malignancy treatment at [facility], status post tracheostomy and PEG tube placement, also has underlying cirrhosis likely alcoholic who presents to the ED with complaints of increased SOB, and worsening Cough x 2 days. He has had increased yellowish secretions from his trach. He uses NCO2 at night. He had hypoxia in the ED, and was treated with Nebulizer treatments and suctioning and had improvement. He was found to have an elevated Lactic acid level of 2.57, and findings of ATx vs Pneumonia on Chest X-ray. He was placed on IV Antibiotic Rx of Vancomycin and Cefepime and referred for admission.    Subjective:    Philip Beck has, No headache, No chest pain, No abdominal pain - No Nausea, No new weakness tingling or numbness, ++ productiive Cough & mild SOB.    Assessment  & Plan :     1. Sepsis due to Nosocomial pneumonia with acute on chronic hypoxic respiratory failure in a patient with head and neck cancer status post tracheostomy. Seen by pulmonary critical care, we will continue empiric IV antibiotics, follow culture results, continue trach site care along with supportive treatment with oxygen and nebulizer treatments as needed along with  pulmonary toilet. Sepsis physiology has resolved. We will add chest PT, clinically little better.  Patient wants gentle medical treatment directed towards comfort, he is interested in hospice, palliative care consulted. We'll continue empiric antibiotics and then eventually discharge on oral antibiotics with either residential hospice or home hospice.  2. Head and neck cancer. Status post recast her me, her oncologist is at [facility], talked with patient and son, he is not interested in any further chemotherapy or radiation treatments, he wants to pursue gentle medical treatment and hospice. We will consult palliative care.  3. Anemia of chronic disease. Stable.  4. History of smoking and alcohol. Has quit both. Consult to continue to abstain.  5. PEG tube. Nothing by mouth, all medications and diet while PEG tube, nutritionist consulted. This is due to head and neck malignancy and tracheostomy.  6. History of Alcoholic cirrhosis. No acute issue.    Code Status : DNR  Family Communication  : Son  Disposition Plan  : Residential hospice was his home hospice  Barriers For Discharge : Pall care for Hospice arrangements  Consults  : PCCM, Pall Care  Procedures  :   DVT Prophylaxis  :  Lovenox   Lab Results  Component Value Date   PLT 249 03/23/2016    Antibiotics  :     Anti-infectives    Start     Dose/Rate Route Frequency Ordered Stop   03/22/16 2300  ceFEPIme (MAXIPIME) 2 g in dextrose 5 % 50 mL IVPB     2 g 100 mL/hr over 30 Minutes Intravenous Every 24 hours 03/21/16 2358     03/22/16 2200  vancomycin (VANCOCIN) IVPB 750 mg/150 ml premix     750 mg 150 mL/hr over 60 Minutes Intravenous Every 24 hours 03/21/16 2358     03/21/16 2300  ceFEPIme (MAXIPIME) 2 g in dextrose 5 % 50 mL IVPB     2 g 100 mL/hr over 30 Minutes Intravenous  Once 03/21/16 2247 03/22/16 0050   03/21/16 2300  vancomycin (VANCOCIN) IVPB 1000 mg/200 mL premix     1,000 mg 200 mL/hr over 60  Minutes Intravenous  Once 03/21/16 2247 03/22/16 0050        Objective:   Filed Vitals:   03/23/16 0000 03/23/16 0300 03/23/16 0357 03/23/16 0857  BP: 95/64 87/48 97/62  98/59  Pulse:  92  97  Temp: 98.6 F (37 C)  97.6 F (36.4 C) 98.7 F (37.1 C)  TempSrc: Oral  Oral Oral  Resp: 18 20 21 20   SpO2: 98% 100% 100% 100%    Wt Readings from Last 3 Encounters:  03/16/16 51.256 kg (113 lb)  03/16/16 51.347 kg (113 lb 3.2 oz)     Intake/Output Summary (Last 24 hours) at 03/23/16 0916 Last data filed at 03/23/16 0700  Gross per 24 hour  Intake 1440.83 ml  Output   1575 ml  Net -134.17 ml     Physical Exam  Awake Alert, Oriented X 3, No new F.N deficits, Normal affect [place].AT,PERRAL, Trach site stable ++ secretions,  Supple Neck,No JVD, No cervical lymphadenopathy appriciated.  Symmetrical Chest wall movement, Good air movement bilaterally, Coarse B sounds RRR,No Gallops,Rubs or new Murmurs, No Parasternal Heave +ve B.Sounds, Abd Soft, No tenderness, No organomegaly appriciated, No rebound - guarding or rigidity. PEG in place No Cyanosis, Clubbing or edema, No new Rash or bruise      Data Review:    CBC  Recent Labs Lab 03/21/16 2302 03/22/16 0350 03/23/16 0452  WBC 25.1* 20.6* 15.8*  HGB 9.7* 8.2* 8.6*  HCT 31.3* 26.2* 27.2*  PLT 296 248 249  MCV 87.2 86.8 86.3  MCH 27.0 27.2 27.3  MCHC 31.0 31.3 31.6  RDW 15.4 15.7* 15.6*  LYMPHSABS 1.0  --   --   MONOABS 2.3*  --   --   EOSABS 0.3  --   --  BASOSABS 0.0  --   --     Chemistries   Recent Labs Lab 03/21/16 2302 03/22/16 0350 03/23/16 0452  NA 134* 133* 135  K 4.7 3.9 3.4*  CL 96* 102 101  CO2 26 25 25   GLUCOSE 119* 129* 103*  BUN 45* 35* 25*  CREATININE 1.31* 1.06 0.95  CALCIUM 9.0 7.7* 8.4*   ------------------------------------------------------------------------------------------------------------------ No results for input(s): CHOL, HDL, LDLCALC, TRIG, CHOLHDL, LDLDIRECT in the last 72  hours.  No results found for: HGBA1C ------------------------------------------------------------------------------------------------------------------ No results for input(s): TSH, T4TOTAL, T3FREE, THYROIDAB in the last 72 hours.  Invalid input(s): FREET3 ------------------------------------------------------------------------------------------------------------------ No results for input(s): VITAMINB12, FOLATE, FERRITIN, TIBC, IRON, RETICCTPCT in the last 72 hours.  Coagulation profile  Recent Labs Lab 03/21/16 2302 03/22/16 0350  INR 1.43 1.64*    No results for input(s): DDIMER in the last 72 hours.  Cardiac Enzymes No results for input(s): CKMB, TROPONINI, MYOGLOBIN in the last 168 hours.  Invalid input(s): CK ------------------------------------------------------------------------------------------------------------------ No results found for: BNP  Inpatient Medications  Scheduled Meds: . ceFEPime (MAXIPIME) IV  2 g Intravenous Q24H  . enoxaparin (LOVENOX) injection  40 mg Subcutaneous Q24H  . guaiFENesin  10 mL Oral Daily  . potassium chloride  40 mEq Oral Q4H  . spironolactone  25 mg Oral BID  . torsemide  20 mg Oral Daily  . vancomycin  750 mg Intravenous Q24H   Continuous Infusions: . sodium chloride 50 mL/hr at 03/22/16 2319   PRN Meds:.acetaminophen **OR** acetaminophen, albuterol, HYDROmorphone (DILAUDID) injection, ondansetron **OR** ondansetron (ZOFRAN) IV, oxyCODONE  Micro Results Recent Results (from the past 240 hour(s))  Culture, respiratory (NON-Expectorated)     Status: None (Preliminary result)   Collection Time: 03/21/16 11:00 PM  Result Value Ref Range Status   Specimen Description TRACHEAL ASPIRATE  Final   Special Requests NONE  Final   Gram Stain PENDING  Incomplete   Culture   Final    Culture reincubated for better growth Performed at [facility]    Report Status PENDING  Incomplete  MRSA PCR Screening     Status: None    Collection Time: 03/22/16  8:03 PM  Result Value Ref Range Status   MRSA by PCR NEGATIVE NEGATIVE Final    Comment:        The GeneXpert MRSA Assay (FDA approved for NASAL specimens only), is one component of a comprehensive MRSA colonization surveillance program. It is not intended to diagnose MRSA infection nor to guide or monitor treatment for MRSA infections.     Radiology Reports Dg Abd 1 View  03/16/2016  CLINICAL DATA:  Leaking percutaneous gastrostomy tube at the skin site after tube down sized at outside institution. EXAM: ABDOMEN - 1 VIEW COMPARISON:  None. FINDINGS: Percutaneous gastrostomy tube in good position with inflated retention balloon overlapping the distal gastric lumen. Contrast was injected, opacifying the distal stomach and duodenal C-loop. Calcification along the pancreatic bed consistent with chronic pancreatitis. No peritoneal leakage is seen. Some skin leakage is seen around the retention disc on the second image. There is no gastric outlet obstruction including by the balloon. No evidence of catheter leakage. Reticular markings at the right base, likely chronic. IMPRESSION: 1. Located percutaneous gastrostomy tube. No gastric outlet obstruction or catheter leakage. 2. Calcified chronic pancreatitis. Electronically Signed   By: Monte Fantasia M.D.   On: 03/16/2016 12:01   Dg Chest Port 1 View  03/22/2016  CLINICAL DATA:  Sepsis and shortness of breath tonight. EXAM:  PORTABLE CHEST 1 VIEW COMPARISON:  03/21/2016 FINDINGS: Shallow inspiration with atelectasis or infiltration in the lung bases. No change since prior study. Heart size and pulmonary vascularity are normal. Mediastinal contours appear intact. Tracheostomy. Old right clavicular fracture. No blunting of costophrenic angles. No pneumothorax. IMPRESSION: Shallow inspiration with atelectasis or infiltration in the lung bases. Electronically Signed   By: Lucienne Capers M.D.   On: 03/22/2016 04:45   Dg Chest  Port 1 View  03/21/2016  CLINICAL DATA:  Shortness of breath, trach EXAM: PORTABLE CHEST 1 VIEW COMPARISON:  03/08/2016 FINDINGS: Mild bibasilar opacities, atelectasis versus pneumonia. No pleural effusion or pneumothorax. Tracheostomy in satisfactory position. Heart is normal in size. IMPRESSION: Mild bibasilar opacities, atelectasis versus pneumonia. Electronically Signed   By: Julian Hy M.D.   On: 03/21/2016 22:20    Time Spent in minutes  25   Bellatrix Devonshire K M.D on 03/23/2016 at 9:16 AM  Between 7am to 7pm - Pager - [phone]  After 7pm go to [email or web address] - password [facility]  [facility] -  Office  [phone]

## 2016-03-23 NOTE — Progress Notes (Addendum)
4:45pm [facility] can accept pt tomorrow- they are ordering trach supplies this evening.  Facility request that transport be set up for 11am- MD paged to inform  10am CSW consulted for residential hospice.  Pt is followed by [facility] as outpatient- CSW called [facility] to inform of current recommendation for residential hospice- pt case manager will follow up with CSW regarding transfer to inpatient hospice  CSW will continue to follow  Domenica Reamer, [place] Social Worker [phone]

## 2016-03-24 LAB — GLUCOSE, CAPILLARY
GLUCOSE-CAPILLARY: 160 mg/dL — AB (ref 65–99)
GLUCOSE-CAPILLARY: 114 mg/dL — AB (ref 65–99)
Glucose-Capillary: 207 mg/dL — ABNORMAL HIGH (ref 65–99)

## 2016-03-24 LAB — CULTURE, RESPIRATORY W GRAM STAIN: Culture: NORMAL

## 2016-03-24 LAB — POTASSIUM: POTASSIUM: 3.6 mmol/L (ref 3.5–5.1)

## 2016-03-24 LAB — MAGNESIUM: Magnesium: 1.7 mg/dL (ref 1.7–2.4)

## 2016-03-24 LAB — CULTURE, RESPIRATORY

## 2016-03-24 MED ORDER — METRONIDAZOLE 50 MG/ML ORAL SUSPENSION: 500.0000 mg | Freq: Three times a day (TID) | ORAL | Status: AC

## 2016-03-24 MED ORDER — VANCOMYCIN HCL IN DEXTROSE 750-5 MG/150ML-% IV SOLN
750.0000 mg | INTRAVENOUS | Status: DC
Start: 2016-03-24 — End: 2016-03-26
  Administered 2016-03-24 – 2016-03-25 (×2): 750 mg via INTRAVENOUS
  Filled 2016-03-24 (×2): qty 150

## 2016-03-24 MED ORDER — DEXTROSE 5 % IV SOLN
1.0000 g | INTRAVENOUS | Status: DC
  Administered 2016-03-24 – 2016-03-25 (×2): 1 g via INTRAVENOUS
  Filled 2016-03-24 (×3): qty 10

## 2016-03-24 MED ORDER — MORPHINE SULFATE (CONCENTRATE) 10 MG/0.5ML PO SOLN
10.0000 mg | ORAL | Status: AC | PRN
Start: 2016-03-24 — End: ?

## 2016-03-24 MED ORDER — LORAZEPAM 2 MG/ML PO CONC: 1.0000 mg | Freq: Four times a day (QID) | ORAL | Status: AC | PRN

## 2016-03-24 MED ORDER — MOXIFLOXACIN HCL 400 MG PO TABS: 400.0000 mg | ORAL_TABLET | Freq: Every day | ORAL | Status: AC

## 2016-03-24 MED ORDER — OXYCODONE-ACETAMINOPHEN 10-325 MG PO TABS: 1.0000 | ORAL_TABLET | ORAL | Status: AC | PRN

## 2016-03-24 NOTE — Progress Notes (Signed)
Patient discharge cancelled for today, due to positive blood cultures per dr Candiss Norse.

## 2016-03-24 NOTE — Progress Notes (Signed)
CRITICAL VALUE ALERT  Critical value received:  +blood cultures, anaerobic gram + cocci in clusters  Date of notification:  03/24/16  Time of notification:  [id number]  Critical value read back: Yes  Nurse who received alert:  Ailene Ravel, RN  MD notified (1st page):  Schorr, NP  Time of first page:  [phone]  Orders in place

## 2016-03-24 NOTE — Progress Notes (Signed)
Pharmacy Antibiotic Note  Philip Beck is a 70 y.o. male admitted on 03/21/2016 with bacteremia.  Pharmacy has been consulted for Vancomycin dosing.    Plan: 70yo male who had been receiving Vancomycin and Cefepime & stopped for discharge today.  Vancomycin to resume for (+)blood culture.  No labs today.  Cr < 1 yesterday.  Height: 5\' 5"  (165.1 cm) Weight: 112 lb 14 oz (51.2 kg) IBW/kg (Calculated) : 61.5  Temp (24hrs), Avg:98.5 F (36.9 C), Min:97.8 F (36.6 C), Max:98.9 F (37.2 C)   Recent Labs Lab 03/21/16 2302 03/21/16 2323 03/22/16 0201 03/22/16 0350 03/22/16 1822 03/23/16 0452  WBC 25.1*  --   --  20.6*  --  15.8*  CREATININE 1.31*  --   --  1.06  --  0.95  LATICACIDVEN  --  2.57* 0.80 1.1 1.3  --     Estimated Creatinine Clearance: 54.6 mL/min (by C-G formula based on Cr of 0.95).    Allergies  Allergen Reactions  . Penicillins Itching    Has patient had a PCN reaction causing immediate rash, facial/tongue/throat swelling, SOB or lightheadedness with hypotension:YES Has patient had a PCN reaction causing severe rash involving mucus membranes or skin necrosis: NO Has patient had a PCN reaction that required hospitalization NO Has patient had a PCN reaction occurring within the last 10 years: NO If all of the above answers are "NO", then may proceed with Cephalosporin use.    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  Gracy Bruins, PharmD Clinical Pharmacist [facility]

## 2016-03-24 NOTE — Progress Notes (Signed)
Pt refusing CPT at this time. RT will check back to reassess. RT will continue to monitor.

## 2016-03-24 NOTE — Progress Notes (Signed)
PROGRESS NOTE                                                                                                                                                                                                             Patient Demographics:    Philip Beck, is a 70 y.o. male, DOB - 04/08/1946, [id number]  Admit date - 03/21/2016   Admitting Physician Theressa Millard, MD  Outpatient Primary MD for the patient is [place], DO  LOS - 2  Outpatient Specialists: Oncologist at [facility]  Chief Complaint  Patient presents with  . Shortness of Breath       Brief Narrative    Philip Beck is a 70 y.o. 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 He has had increased yellowish secretions from his trach. He uses [employer] at night. He had hypoxia in the ED, and was treated with Nebulizer treatments and suctioning and had improvement. He was found to have an elevated Lactic acid level of 2.57, and findings of ATx vs Pneumonia on Chest X-ray. He was placed on IV Antibiotic Rx of Vancomycin and Cefepime and referred for admission.    Subjective:    Philip Beck today has, No headache, No chest pain, No abdominal pain - No Nausea, No new weakness tingling or numbness, ++ productiive Cough & mild SOB.    Assessment  & Plan :    1. Sepsis due to Nosocomial pneumonia with acute on chronic hypoxic respiratory failure in a patient with head and neck cancer status post tracheostomy. Seen by pulmonary critical care, we will continue empiric IV antibiotics, 1/2 Blood culture positive for gram positive cocci with cultures may be contaminant, continue trach site care along with supportive treatment  with oxygen and nebulizer treatments as needed along with pulmonary toilet. Sepsis physiology has resolved. We will add chest PT, clinically little better.  Patient wants gentle medical treatment directed towards comfort, he is interested in hospice, palliative care consulted. We'll continue empiric antibiotics and then eventually discharge on oral antibiotics with either residential hospice or home hospice. Once blood cultures are finalized will be discharged  to residential hospice. He still wants gentle medical treatment to continue.  2. Head and neck cancer. Status post recast her me, her oncologist is at [facility], talked with patient and son, he is not interested in any further chemotherapy or radiation treatments, he wants to pursue gentle medical treatment and hospice. We will consult palliative care.  3. Anemia of chronic disease. Stable.  4. History of smoking and alcohol. Has quit both. Consult to continue to abstain.  5. PEG tube. Nothing by mouth, all medications and diet while PEG tube, nutritionist consulted. This is due to head and neck malignancy and tracheostomy.  6. History of Alcoholic cirrhosis. No acute issue.    Code Status : DNR  Family Communication  : Son  Disposition Plan  : Residential hospice most likely tomorrow once blood culture is finalized  Barriers For Discharge : Positive blood culture  Consults  : PCCM, Pall Care  Procedures  :   DVT Prophylaxis  :  Lovenox   Lab Results  Component Value Date   PLT 249 03/23/2016    Antibiotics  :     Anti-infectives    Start     Dose/Rate Route Frequency Ordered Stop   03/24/16 1030  cefTRIAXone (ROCEPHIN) 1 g in dextrose 5 % 50 mL IVPB     1 g 100 mL/hr over 30 Minutes Intravenous Every 24 hours 03/24/16 1016     03/24/16 0000  moxifloxacin (AVELOX) 400 MG tablet     400 mg Oral Daily 03/24/16 0752     03/24/16 0000  metroNIDAZOLE (FLAGYL) 50 mg/ml oral suspension     500 mg Oral 3 times daily  03/24/16 0752     03/22/16 2300  ceFEPIme (MAXIPIME) 2 g in dextrose 5 % 50 mL IVPB  Status:  Discontinued     2 g 100 mL/hr over 30 Minutes Intravenous Every 24 hours 03/21/16 2358 03/24/16 1012   03/22/16 2200  vancomycin (VANCOCIN) IVPB 750 mg/150 ml premix  Status:  Discontinued     750 mg 150 mL/hr over 60 Minutes Intravenous Every 24 hours 03/21/16 2358 03/24/16 1012   03/21/16 2300  ceFEPIme (MAXIPIME) 2 g in dextrose 5 % 50 mL IVPB     2 g 100 mL/hr over 30 Minutes Intravenous  Once 03/21/16 2247 03/22/16 0050   03/21/16 2300  vancomycin (VANCOCIN) IVPB 1000 mg/200 mL premix     1,000 mg 200 mL/hr over 60 Minutes Intravenous  Once 03/21/16 2247 03/22/16 0050        Objective:   Filed Vitals:   03/24/16 0700 03/24/16 0752 03/24/16 0800 03/24/16 0900  BP: 97/65 97/65 109/61 104/60  Pulse:  97    Temp:   97.8 F (36.6 C)   TempSrc:   Oral   Resp: 17 21 17 15   Height:      Weight:      SpO2: 100% 100% 99% 100%    Wt Readings from Last 3 Encounters:  03/24/16 51.2 kg (112 lb 14 oz)  03/16/16 51.256 kg (113 lb)  03/16/16 51.347 kg (113 lb 3.2 oz)     Intake/Output Summary (Last 24 hours) at 03/24/16 1017 Last data filed at 03/24/16 0600  Gross per 24 hour  Intake   1031 ml  Output    830 ml  Net    201 ml     Physical Exam  Awake Alert, Oriented X 3, No new F.N deficits, Normal affect [place].AT,PERRAL, Trach site stable ++ secretions,  Supple Neck,No JVD, No cervical lymphadenopathy appriciated.  Symmetrical Chest wall movement, Good air movement bilaterally, Coarse B sounds RRR,No Gallops,Rubs or new Murmurs, No Parasternal Heave +ve B.Sounds, Abd Soft, No tenderness, No organomegaly appriciated, No rebound - guarding or rigidity. PEG in place No Cyanosis, Clubbing or edema, No new Rash or bruise      Data Review:    CBC  Recent Labs Lab 03/21/16 2302 03/22/16 0350 03/23/16 0452  WBC 25.1* 20.6* 15.8*  HGB 9.7* 8.2* 8.6*  HCT 31.3* 26.2* 27.2*   PLT 296 248 249  MCV 87.2 86.8 86.3  MCH 27.0 27.2 27.3  MCHC 31.0 31.3 31.6  RDW 15.4 15.7* 15.6*  LYMPHSABS 1.0  --   --   MONOABS 2.3*  --   --   EOSABS 0.3  --   --   BASOSABS 0.0  --   --     Chemistries   Recent Labs Lab 03/21/16 2302 03/22/16 0350 03/23/16 0452 03/24/16 0342  NA 134* 133* 135  --   K 4.7 3.9 3.4* 3.6  CL 96* 102 101  --   CO2 26 25 25   --   GLUCOSE 119* 129* 103*  --   BUN 45* 35* 25*  --   CREATININE 1.31* 1.06 0.95  --   CALCIUM 9.0 7.7* 8.4*  --   MG  --   --   --  1.7   ------------------------------------------------------------------------------------------------------------------ No results for input(s): CHOL, HDL, LDLCALC, TRIG, CHOLHDL, LDLDIRECT in the last 72 hours.  No results found for: HGBA1C ------------------------------------------------------------------------------------------------------------------ No results for input(s): TSH, T4TOTAL, T3FREE, THYROIDAB in the last 72 hours.  Invalid input(s): FREET3 ------------------------------------------------------------------------------------------------------------------ No results for input(s): VITAMINB12, FOLATE, FERRITIN, TIBC, IRON, RETICCTPCT in the last 72 hours.  Coagulation profile  Recent Labs Lab 03/21/16 2302 03/22/16 0350  INR 1.43 1.64*    No results for input(s): DDIMER in the last 72 hours.  Cardiac Enzymes No results for input(s): CKMB, TROPONINI, MYOGLOBIN in the last 168 hours.  Invalid input(s): CK ------------------------------------------------------------------------------------------------------------------ No results found for: BNP  Inpatient Medications  Scheduled Meds: . cefTRIAXone (ROCEPHIN)  IV  1 g Intravenous Q24H  . enoxaparin (LOVENOX) injection  40 mg Subcutaneous Q24H  . feeding supplement (JEVITY 1.2 CAL)  237 mL Per Tube 6 X Daily  . guaiFENesin  10 mL Oral Daily  . spironolactone  25 mg Oral BID  . torsemide  20 mg Oral  Daily   Continuous Infusions:   PRN Meds:.acetaminophen **OR** acetaminophen, albuterol, HYDROmorphone (DILAUDID) injection, [DISCONTINUED] ondansetron **OR** ondansetron (ZOFRAN) IV, oxyCODONE  Micro Results Recent Results (from the past 240 hour(s))  Culture, respiratory (NON-Expectorated)     Status: None (Preliminary result)   Collection Time: 03/21/16 11:00 PM  Result Value Ref Range Status   Specimen Description TRACHEAL ASPIRATE  Final   Special Requests NONE  Final   Gram Stain PENDING  Incomplete   Culture   Final    NORMAL OROPHARYNGEAL FLORA Performed at [facility]    Report Status PENDING  Incomplete  Blood Culture (routine x 2)     Status: None (Preliminary result)   Collection Time: 03/21/16 11:02 PM  Result Value Ref Range Status   Specimen Description BLOOD LEFT ARM  Final   Special Requests BOTTLES DRAWN AEROBIC AND ANAEROBIC 5ML  Final   Culture  Setup Time   Final    GRAM POSITIVE COCCI IN CLUSTERS ANAEROBIC BOTTLE ONLY CRITICAL RESULT CALLED TO, READ BACK BY AND  VERIFIED WITH: Olga Coaster @[phone]  03/24/16 MKELLY    Culture CULTURE REINCUBATED FOR BETTER GROWTH  Final   Report Status PENDING  Incomplete  Blood Culture (routine x 2)     Status: None (Preliminary result)   Collection Time: 03/21/16 11:14 PM  Result Value Ref Range Status   Specimen Description BLOOD LEFT HAND  Final   Special Requests IN PEDIATRIC BOTTLE 3ML  Final   Culture NO GROWTH 1 DAY  Final   Report Status PENDING  Incomplete  Urine culture     Status: Abnormal   Collection Time: 03/22/16  1:20 AM  Result Value Ref Range Status   Specimen Description URINE, RANDOM  Final   Special Requests NONE  Final   Culture 3,000 COLONIES/mL INSIGNIFICANT GROWTH (A)  Final   Report Status 03/23/2016 FINAL  Final  MRSA PCR Screening     Status: None   Collection Time: 03/22/16  8:03 PM  Result Value Ref Range Status   MRSA by PCR NEGATIVE NEGATIVE Final    Comment:        The  GeneXpert MRSA Assay (FDA approved for NASAL specimens only), is one component of a comprehensive MRSA colonization surveillance program. It is not intended to diagnose MRSA infection nor to guide or monitor treatment for MRSA infections.     Radiology Reports Dg Abd 1 View  03/16/2016  CLINICAL DATA:  Leaking percutaneous gastrostomy tube at the skin site after tube down sized at outside institution. EXAM: ABDOMEN - 1 VIEW COMPARISON:  None. FINDINGS: Percutaneous gastrostomy tube in good position with inflated retention balloon overlapping the distal gastric lumen. Contrast was injected, opacifying the distal stomach and duodenal C-loop. Calcification along the pancreatic bed consistent with chronic pancreatitis. No peritoneal leakage is seen. Some skin leakage is seen around the retention disc on the second image. There is no gastric outlet obstruction including by the balloon. No evidence of catheter leakage. Reticular markings at the right base, likely chronic. IMPRESSION: 1. Located percutaneous gastrostomy tube. No gastric outlet obstruction or catheter leakage. 2. Calcified chronic pancreatitis. Electronically Signed   By: Monte Fantasia M.D.   On: 03/16/2016 12:01   Dg Chest Port 1 View  03/22/2016  CLINICAL DATA:  Sepsis and shortness of breath tonight. EXAM: PORTABLE CHEST 1 VIEW COMPARISON:  03/21/2016 FINDINGS: Shallow inspiration with atelectasis or infiltration in the lung bases. No change since prior study. Heart size and pulmonary vascularity are normal. Mediastinal contours appear intact. Tracheostomy. Old right clavicular fracture. No blunting of costophrenic angles. No pneumothorax. IMPRESSION: Shallow inspiration with atelectasis or infiltration in the lung bases. Electronically Signed   By: Lucienne Capers M.D.   On: 03/22/2016 04:45   Dg Chest Port 1 View  03/21/2016  CLINICAL DATA:  Shortness of breath, trach EXAM: PORTABLE CHEST 1 VIEW COMPARISON:  03/08/2016 FINDINGS:  Mild bibasilar opacities, atelectasis versus pneumonia. No pleural effusion or pneumothorax. Tracheostomy in satisfactory position. Heart is normal in size. IMPRESSION: Mild bibasilar opacities, atelectasis versus pneumonia. Electronically Signed   By: Julian Hy M.D.   On: 03/21/2016 22:20    Time Spent in minutes  25   SINGH,PRASHANT K M.D on 03/24/2016 at 10:17 AM  Between 7am to 7pm - Pager - [phone]  After 7pm go to [email or web address] - password [email or web address]  [facility] -  Office  [phone]

## 2016-03-24 NOTE — Progress Notes (Signed)
Pt is not tolerating the chest vest.  He has a good cough and secretions are moving well.  Suctioned patient and cleared airway.

## 2016-03-24 NOTE — Progress Notes (Signed)
Report called to [place] at [facility]. Urban Gibson Lexine Baton

## 2016-03-25 LAB — GLUCOSE, CAPILLARY
GLUCOSE-CAPILLARY: 117 mg/dL — AB (ref 65–99)
GLUCOSE-CAPILLARY: 136 mg/dL — AB (ref 65–99)
GLUCOSE-CAPILLARY: 167 mg/dL — AB (ref 65–99)
GLUCOSE-CAPILLARY: 175 mg/dL — AB (ref 65–99)
Glucose-Capillary: 121 mg/dL — ABNORMAL HIGH (ref 65–99)
Glucose-Capillary: 258 mg/dL — ABNORMAL HIGH (ref 65–99)
Glucose-Capillary: 84 mg/dL (ref 65–99)

## 2016-03-25 NOTE — Progress Notes (Signed)
PROGRESS NOTE                                                                                                                                                                                                             Patient Demographics:    Philip Beck, is a 70 y.o. male, DOB - 1946/11/21, [id number]  Admit date - 03/21/2016   Admitting Physician Theressa Millard, MD  Outpatient Primary MD for the patient is Whittemore, DO  LOS - 3  Outpatient Specialists: Oncologist at [facility]  Chief Complaint  Patient presents with  . Shortness of Breath       Brief Narrative    Philip Beck is a 70 y.o. 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 He has had increased yellowish secretions from his trach. He uses [employer] at night. He had hypoxia in the ED, and was treated with Nebulizer treatments and suctioning and had improvement. He was found to have an elevated Lactic acid level of 2.57, and findings of ATx vs Pneumonia on Chest X-ray. He was placed on IV Antibiotic Rx of Vancomycin and Cefepime and referred for admission.    Subjective:    Philip Beck today has, No headache, No chest pain, No abdominal pain - No Nausea, No new weakness tingling or numbness, + productiive Cough & mild SOB.    Assessment  & Plan :    1. Sepsis due to Nosocomial pneumonia with acute on chronic hypoxic respiratory failure in a patient with head and neck cancer status post tracheostomy. Seen by pulmonary critical care, we will continue empiric IV antibiotics, sputum culture growing gram-positive cocci in clusters, 1/2 Blood culture positive for gram positive cocci with cultures may be contaminant but  could be true infection as well, continue trach site care along with supportive treatment with oxygen and nebulizer treatments as needed along with pulmonary toilet. Sepsis physiology has resolved. We will add chest PT, clinically little better.  Patient wants gentle medical treatment directed towards comfort, he is interested in hospice, palliative care consulted. We'll continue empiric antibiotics and then eventually discharge on oral antibiotics with  either residential hospice or home hospice. Once blood cultures are finalized will be discharged to residential hospice. He still wants gentle medical treatment to continue.  2. Head and neck cancer. Status post recast her me, her oncologist is at [facility], talked with patient and son, he is not interested in any further chemotherapy or radiation treatments, he wants to pursue gentle medical treatment and hospice. We will consult palliative care.  3. Anemia of chronic disease. Stable.  4. History of smoking and alcohol. Has quit both. Consult to continue to abstain.  5. PEG tube. Nothing by mouth, all medications and diet while PEG tube, nutritionist consulted. This is due to head and neck malignancy and tracheostomy.  6. History of Alcoholic cirrhosis. No acute issue.    Code Status : DNR  Family Communication  : Son  Disposition Plan  : Residential hospice most likely tomorrow once blood culture is finalized  Barriers For Discharge : Positive blood culture  Consults  : PCCM, Pall Care  Procedures  :   DVT Prophylaxis  :  Lovenox   Lab Results  Component Value Date   PLT 249 03/23/2016    Antibiotics  :     Anti-infectives    Start     Dose/Rate Route Frequency Ordered Stop   03/24/16 2200  vancomycin (VANCOCIN) IVPB 750 mg/150 ml premix     750 mg 150 mL/hr over 60 Minutes Intravenous Every 24 hours 03/24/16 1120     03/24/16 1100  cefTRIAXone (ROCEPHIN) 1 g in dextrose 5 % 50 mL IVPB     1 g 100 mL/hr over 30  Minutes Intravenous Every 24 hours 03/24/16 1016     03/24/16 0000  moxifloxacin (AVELOX) 400 MG tablet     400 mg Oral Daily 03/24/16 0752     03/24/16 0000  metroNIDAZOLE (FLAGYL) 50 mg/ml oral suspension     500 mg Oral 3 times daily 03/24/16 0752     03/22/16 2300  ceFEPIme (MAXIPIME) 2 g in dextrose 5 % 50 mL IVPB  Status:  Discontinued     2 g 100 mL/hr over 30 Minutes Intravenous Every 24 hours 03/21/16 2358 03/24/16 1012   03/22/16 2200  vancomycin (VANCOCIN) IVPB 750 mg/150 ml premix  Status:  Discontinued     750 mg 150 mL/hr over 60 Minutes Intravenous Every 24 hours 03/21/16 2358 03/24/16 1012   03/21/16 2300  ceFEPIme (MAXIPIME) 2 g in dextrose 5 % 50 mL IVPB     2 g 100 mL/hr over 30 Minutes Intravenous  Once 03/21/16 2247 03/22/16 0050   03/21/16 2300  vancomycin (VANCOCIN) IVPB 1000 mg/200 mL premix     1,000 mg 200 mL/hr over 60 Minutes Intravenous  Once 03/21/16 2247 03/22/16 0050        Objective:   Filed Vitals:   03/25/16 0500 03/25/16 0600 03/25/16 0800 03/25/16 0841  BP: 99/64 114/63 117/61   Pulse:      Temp:      TempSrc:      Resp: 19 17 14 25   Height:      Weight:      SpO2: 99% 100% 100% 100%    Wt Readings from Last 3 Encounters:  03/25/16 49.8 kg (109 lb 12.6 oz)  03/16/16 51.256 kg (113 lb)  03/16/16 51.347 kg (113 lb 3.2 oz)     Intake/Output Summary (Last 24 hours) at 03/25/16 1002 Last data filed at 03/25/16 0800  Gross per 24 hour  Intake  744 ml  Output   2000 ml  Net  -1256 ml     Physical Exam  Awake Alert, Oriented X 3, No new F.N deficits, Normal affect [place].AT,PERRAL, Trach site stable ++ secretions,  Supple Neck,No JVD, No cervical lymphadenopathy appriciated.  Symmetrical Chest wall movement, Good air movement bilaterally, Coarse B sounds RRR,No Gallops,Rubs or new Murmurs, No Parasternal Heave +ve B.Sounds, Abd Soft, No tenderness, No organomegaly appriciated, No rebound - guarding or rigidity. PEG in place No  Cyanosis, Clubbing or edema, No new Rash or bruise      Data Review:    CBC  Recent Labs Lab 03/21/16 2302 03/22/16 0350 03/23/16 0452  WBC 25.1* 20.6* 15.8*  HGB 9.7* 8.2* 8.6*  HCT 31.3* 26.2* 27.2*  PLT 296 248 249  MCV 87.2 86.8 86.3  MCH 27.0 27.2 27.3  MCHC 31.0 31.3 31.6  RDW 15.4 15.7* 15.6*  LYMPHSABS 1.0  --   --   MONOABS 2.3*  --   --   EOSABS 0.3  --   --   BASOSABS 0.0  --   --     Chemistries   Recent Labs Lab 03/21/16 2302 03/22/16 0350 03/23/16 0452 03/24/16 0342  NA 134* 133* 135  --   K 4.7 3.9 3.4* 3.6  CL 96* 102 101  --   CO2 26 25 25   --   GLUCOSE 119* 129* 103*  --   BUN 45* 35* 25*  --   CREATININE 1.31* 1.06 0.95  --   CALCIUM 9.0 7.7* 8.4*  --   MG  --   --   --  1.7   ------------------------------------------------------------------------------------------------------------------ No results for input(s): CHOL, HDL, LDLCALC, TRIG, CHOLHDL, LDLDIRECT in the last 72 hours.  No results found for: HGBA1C ------------------------------------------------------------------------------------------------------------------ No results for input(s): TSH, T4TOTAL, T3FREE, THYROIDAB in the last 72 hours.  Invalid input(s): FREET3 ------------------------------------------------------------------------------------------------------------------ No results for input(s): VITAMINB12, FOLATE, FERRITIN, TIBC, IRON, RETICCTPCT in the last 72 hours.  Coagulation profile  Recent Labs Lab 03/21/16 2302 03/22/16 0350  INR 1.43 1.64*    No results for input(s): DDIMER in the last 72 hours.  Cardiac Enzymes No results for input(s): CKMB, TROPONINI, MYOGLOBIN in the last 168 hours.  Invalid input(s): CK ------------------------------------------------------------------------------------------------------------------ No results found for: BNP  Inpatient Medications  Scheduled Meds: . cefTRIAXone (ROCEPHIN)  IV  1 g Intravenous Q24H  .  enoxaparin (LOVENOX) injection  40 mg Subcutaneous Q24H  . feeding supplement (JEVITY 1.2 CAL)  237 mL Per Tube 6 X Daily  . guaiFENesin  10 mL Oral Daily  . spironolactone  25 mg Oral BID  . torsemide  20 mg Oral Daily  . vancomycin  750 mg Intravenous Q24H   Continuous Infusions:   PRN Meds:.acetaminophen **OR** acetaminophen, albuterol, HYDROmorphone (DILAUDID) injection, [DISCONTINUED] ondansetron **OR** ondansetron (ZOFRAN) IV, oxyCODONE  Micro Results Recent Results (from the past 240 hour(s))  Culture, respiratory (NON-Expectorated)     Status: None   Collection Time: 03/21/16 11:00 PM  Result Value Ref Range Status   Specimen Description TRACHEAL ASPIRATE  Final   Special Requests NONE  Final   Gram Stain   Final    FEW WBC PRESENT, PREDOMINANTLY MONONUCLEAR RARE SQUAMOUS EPITHELIAL CELLS PRESENT ABUNDANT GRAM POSITIVE COCCI IN PAIRS IN CLUSTERS FEW GRAM POSITIVE RODS Performed at [facility]    Culture   Final    NORMAL OROPHARYNGEAL FLORA Performed at [facility]    Report Status 03/24/2016 FINAL  Final  Blood Culture (routine x 2)     Status: None (Preliminary result)   Collection Time: 03/21/16 11:02 PM  Result Value Ref Range Status   Specimen Description BLOOD LEFT ARM  Final   Special Requests BOTTLES DRAWN AEROBIC AND ANAEROBIC 5ML  Final   Culture  Setup Time   Final    GRAM POSITIVE COCCI IN CLUSTERS ANAEROBIC BOTTLE ONLY CRITICAL RESULT CALLED TO, READ BACK BY AND VERIFIED WITH: M MASON,RN @0543  03/24/16 MKELLY    Culture CULTURE REINCUBATED FOR BETTER GROWTH  Final   Report Status PENDING  Incomplete  Blood Culture (routine x 2)     Status: None (Preliminary result)   Collection Time: 03/21/16 11:14 PM  Result Value Ref Range Status   Specimen Description BLOOD LEFT HAND  Final   Special Requests IN PEDIATRIC BOTTLE 3ML  Final   Culture NO GROWTH 2 DAYS  Final   Report Status PENDING  Incomplete  Urine culture     Status: Abnormal    Collection Time: 03/22/16  1:20 AM  Result Value Ref Range Status   Specimen Description URINE, RANDOM  Final   Special Requests NONE  Final   Culture 3,000 COLONIES/mL INSIGNIFICANT GROWTH (A)  Final   Report Status 03/23/2016 FINAL  Final  MRSA PCR Screening     Status: None   Collection Time: 03/22/16  8:03 PM  Result Value Ref Range Status   MRSA by PCR NEGATIVE NEGATIVE Final    Comment:        The GeneXpert MRSA Assay (FDA approved for NASAL specimens only), is one component of a comprehensive MRSA colonization surveillance program. It is not intended to diagnose MRSA infection nor to guide or monitor treatment for MRSA infections.     Radiology Reports Dg Abd 1 View  03/16/2016  CLINICAL DATA:  Leaking percutaneous gastrostomy tube at the skin site after tube down sized at outside institution. EXAM: ABDOMEN - 1 VIEW COMPARISON:  None. FINDINGS: Percutaneous gastrostomy tube in good position with inflated retention balloon overlapping the distal gastric lumen. Contrast was injected, opacifying the distal stomach and duodenal C-loop. Calcification along the pancreatic bed consistent with chronic pancreatitis. No peritoneal leakage is seen. Some skin leakage is seen around the retention disc on the second image. There is no gastric outlet obstruction including by the balloon. No evidence of catheter leakage. Reticular markings at the right base, likely chronic. IMPRESSION: 1. Located percutaneous gastrostomy tube. No gastric outlet obstruction or catheter leakage. 2. Calcified chronic pancreatitis. Electronically Signed   By: Monte Fantasia M.D.   On: 03/16/2016 12:01   Dg Chest Port 1 View  03/22/2016  CLINICAL DATA:  Sepsis and shortness of breath tonight. EXAM: PORTABLE CHEST 1 VIEW COMPARISON:  03/21/2016 FINDINGS: Shallow inspiration with atelectasis or infiltration in the lung bases. No change since prior study. Heart size and pulmonary vascularity are normal. Mediastinal  contours appear intact. Tracheostomy. Old right clavicular fracture. No blunting of costophrenic angles. No pneumothorax. IMPRESSION: Shallow inspiration with atelectasis or infiltration in the lung bases. Electronically Signed   By: Lucienne Capers M.D.   On: 03/22/2016 04:45   Dg Chest Port 1 View  03/21/2016  CLINICAL DATA:  Shortness of breath, trach EXAM: PORTABLE CHEST 1 VIEW COMPARISON:  03/08/2016 FINDINGS: Mild bibasilar opacities, atelectasis versus pneumonia. No pleural effusion or pneumothorax. Tracheostomy in satisfactory position. Heart is normal in size. IMPRESSION: Mild bibasilar opacities, atelectasis versus pneumonia. Electronically Signed   By: Henderson Newcomer.D.  On: 03/21/2016 22:20    Time Spent in minutes  25   Lala Lund K M.D on 03/25/2016 at 10:02 AM  Between 7am to 7pm - Pager - [phone]  After 7pm go to [email or web address] - password [facility]  [facility] -  Office  [phone]

## 2016-03-25 NOTE — Progress Notes (Signed)
CSW informed that plan is for pt DC to hospice facility tomorrow- CSW updated [facility] and they will take patient pending bed availability- weekend number is [phone].  Hospice unable to take pt with IV antibiotics so request made to change to PO- MD aware  CSW will continue to follow  Domenica Reamer, [place] Social Worker [phone]

## 2016-03-25 NOTE — Care Management Note (Signed)
Case Management Note  Patient Details  Name: Philip Beck MRN: [id number] Date of Birth: 01-03-1946  Subjective/Objective:    Pt active with [facility], plan is to discharge to their residential hospice facility when he is on PO antibx.  CSW aware.               Expected Discharge Plan:  [place]  In-House Referral:  Clinical Social Work  Status of Service:  Completed, signed off  Anterrio Mccleery, [place] T, [place] 03/25/2016, 3:13 PM

## 2016-03-26 LAB — GLUCOSE, CAPILLARY
Glucose-Capillary: 115 mg/dL — ABNORMAL HIGH (ref 65–99)
Glucose-Capillary: 112 mg/dL — ABNORMAL HIGH (ref 65–99)
Glucose-Capillary: 174 mg/dL — ABNORMAL HIGH (ref 65–99)
Glucose-Capillary: 211 mg/dL — ABNORMAL HIGH (ref 65–99)

## 2016-03-26 LAB — BASIC METABOLIC PANEL
Anion gap: 11 (ref 5–15)
BUN: 26 mg/dL — ABNORMAL HIGH (ref 6–20)
CO2: 26 mmol/L (ref 22–32)
Calcium: 8.2 mg/dL — ABNORMAL LOW (ref 8.9–10.3)
Chloride: 100 mmol/L — ABNORMAL LOW (ref 101–111)
Creatinine, Ser: 0.89 mg/dL (ref 0.61–1.24)
GFR calc Af Amer: >60 mL/min (ref >60)
GFR calc non Af Amer: >60 mL/min (ref >60)
Glucose, Bld: 117 mg/dL — ABNORMAL HIGH (ref 65–99)
Potassium: 3.6 mmol/L (ref 3.5–5.1)
Sodium: 137 mmol/L (ref 135–145)

## 2016-03-26 LAB — CULTURE, BLOOD (ROUTINE X 2)

## 2016-03-26 MED ORDER — LORAZEPAM 2 MG/ML IJ SOLN: 0.5000 mg | Freq: Once | INTRAMUSCULAR | Status: DC

## 2016-03-26 MED ORDER — LEVOFLOXACIN 750 MG PO TABS
750.0000 mg | ORAL_TABLET | Freq: Every day | ORAL | Status: DC
  Administered 2016-03-26 – 2016-03-27 (×2): 750 mg
  Filled 2016-03-26 (×2): qty 1

## 2016-03-26 MED ORDER — JEVITY 1.2 CAL PO LIQD: 237.0000 mL | Freq: Every day | ORAL | Status: AC

## 2016-03-26 NOTE — Progress Notes (Signed)
Received call from Govan at [facility]. Per facility, able to take patient. Willing to accept patient as transfer first thing in morning 4/23, as patient family has already completed paperwork. RN to call facility at [phone] and give report in AM. Called patient's son and alerted him to this opportunity. He is eager to have patient placed there as soon as possible.  Milford Cage, RN

## 2016-03-26 NOTE — Progress Notes (Signed)
Patient can be transported to [place] tomorrow morning. Patient's son is unable to get to hospital (he is in [place]) until that time.  Percell Locus Deette Revak LCSWA [phone]

## 2016-03-26 NOTE — Discharge Instructions (Signed)
Disposition. Residential hospice  All diet and medications why a PEG tube  Condition guarded  Goal of care is comfort  Activity as tolerated with full fall precautions

## 2016-03-26 NOTE — Progress Notes (Signed)
Pharmacy Antibiotic Note  Philip Beck is a 70 y.o. male being transitioned to Levaquin per tube for CAP.  Plan: Levaquin 750mg  per tube q24h Will f/u renal function and abx LOT  Height: 5\' 5"  (165.1 cm) Weight: 106 lb 14.8 oz (48.5 kg) IBW/kg (Calculated) : 61.5  Temp (24hrs), Avg:98.3 F (36.8 C), Min:97.2 F (36.2 C), Max:99.7 F (37.6 C)   Recent Labs Lab 03/21/16 2302 03/21/16 2323 03/22/16 0201 03/22/16 0350 03/22/16 1822 03/23/16 0452 03/26/16 0258  WBC 25.1*  --   --  20.6*  --  15.8*  --   CREATININE 1.31*  --   --  1.06  --  0.95 0.89  LATICACIDVEN  --  2.57* 0.80 1.1 1.3  --   --     Estimated Creatinine Clearance: 55.3 mL/min (by C-G formula based on Cr of 0.89).    Allergies  Allergen Reactions  . Penicillins Itching    Has patient had a PCN reaction causing immediate rash, facial/tongue/throat swelling, SOB or lightheadedness with hypotension:YES Has patient had a PCN reaction causing severe rash involving mucus membranes or skin necrosis: NO Has patient had a PCN reaction that required hospitalization NO Has patient had a PCN reaction occurring within the last 10 years: NO If all of the above answers are "NO", then may proceed with Cephalosporin use.    Antimicrobials this admission: Levaquin po 4/22 >> Vanc 4/17 >> 4/22 Cefepime 4/17 >> 4/20   Microbiology results: 4/17 BCx:  #1/2 CONS - likely contaminant 4/17 UCx: insignificant growth 4/17 Trach asp: neg  Thank you for allowing pharmacy to be a part of this patient's care.  Sherlon Handing, PharmD, BCPS Clinical pharmacist, pager [phone] 03/26/2016 5:56 AM

## 2016-03-26 NOTE — Discharge Summary (Addendum)
Philip Beck, is a 70 y.o. male  DOB 11-16-1946  MRN [id number].  Admission date:  03/21/2016  Admitting Physician  Theressa Millard, MD  Discharge Date:  03/27/2016   Primary MD  Octavio Graves, DO  Recommendations for primary care physician for things to follow:   Goal of care is comfort, being discharged to residential hospice   Admission Diagnosis  Pain [R52] HCAP (healthcare-associated pneumonia) [J18.9] Sepsis ([place]) [A41.9] Sepsis, due to unspecified organism [facility]) [A41.9]   Discharge Diagnosis  Pain [R52] HCAP (healthcare-associated pneumonia) [J18.9] Sepsis ([place]) [A41.9] Sepsis, due to unspecified organism ([place]) [A41.9]     Principal Problem:   HCAP (healthcare-associated pneumonia) Active Problems:   Head and neck cancer ([place])   Leukocytosis   Cirrhosis ([place])   Anemia   Tracheostomy in place ([place])   Sepsis [facility])   Palliative care encounter      Past Medical History  Diagnosis Date  . Dysphagia   . Head and neck cancer ([place])   . Depression   . GERD (gastroesophageal reflux disease)   . Corneal ulcer   . DDD (degenerative disc disease), cervical   . Cataract   . Cirrhosis ([place])   . Hearing loss   . Allergic rhinitis   . Seasonal allergies     Past Surgical History  Procedure Laterality Date  . Tracheostomy    . Peg placement    . Eye surgery Bilateral     cataract removeal  . Tonsillectomy    . Esophagogastroduodenoscopy    . Laryngoscopy    . Peg placement N/A 03/16/2016    Procedure: PERCUTANEOUS ENDOSCOPIC GASTROSTOMY (PEG) REPLACEMENT;  Surgeon: Danie Binder, MD;  Location: [facility];  Service: Endoscopy;  Laterality: N/A;  1245       HPI  from the history and physical done on the day of admission:    Philip Beck is a 70 y.o. male with a history of  Chronic Respiratory failure, S/P Tracheostomy, Cirrhosis who presents to the ED with complaints of increased SOB, and worsening Cough x 2 days. He has had increased yellowish secretions from his trach. He uses NCO2 at night. He had hypoxia in the ED, and was treated with Nebulizer treatments and suctioning and had improvement. He was found to have an elevated Lactic acid level of 2.57, and findings of ATx vs Pneumonia on Chest X-ray. He was placed on IV Antibiotic Rx of Vancomycin and Cefepime and referred for admission.      Hospital Course:     1. Sepsis due to Nosocomial pneumonia with acute on chronic hypoxic respiratory failure in a patient with head and neck cancer status post tracheostomy. Seen by pulmonary critical care and palliative care, patient wanted gentle medical treatment only, was treated with IV fluids and empiric IV antibiotics. Sepsis physiology has resolved.   Has clinically improved, blood cultures 1 out of 2 contaminant with coag-negative staph, still has copious trach site secretions and needs continued trach site care with suctioning and pulmonary  toiletry. He be transitioned to oral antibiotics for 6 more days.  Patient wants gentle medical treatment directed towards comfort, he is interested in hospice, palliative care consulted. He will be placed to residential hospice with goal of care being comfort, continue gentle medical treatment as tolerated including oral antibiotics if needed, if declines full comfort care.   2. Head and neck cancer. Status post recast her me, her oncologist is at [facility], talked with patient and son, he is not interested in any further chemotherapy or radiation treatments, he wants to pursue gentle medical treatment and hospice. We will consult palliative care.  3. Anemia of chronic disease. Stable.  4. History of smoking and alcohol. Has quit both. Consult to continue to abstain.  5. PEG tube. Nothing by mouth, all  medications and diet while PEG tube, nutritionist consulted. This is due to head and neck malignancy and tracheostomy.  6. History of Alcoholic cirrhosis. No acute issue.       Follow UP  Follow-up Information    Follow up with CYNTHIA BUTLER, DO.   Why:  As needed   Contact information:   [address] [place] [id number] [phone]        Consults obtained - Pall Care, Pulmonary  Discharge Condition: Guarded  Diet and Activity recommendation: See Discharge Instructions below  Discharge Instructions           Discharge Instructions    Discharge instructions    Complete by:  As directed   Disposition. Residential hospice  All diet and medications why a PEG tube  Condition guarded  Goal of care is comfort  Activity as tolerated with full fall precautions     Increase activity slowly    Complete by:  As directed              Discharge Medications       Medication List    STOP taking these medications        azithromycin 500 MG tablet  Commonly known as:  ZITHROMAX      TAKE these medications        clotrimazole 1 % cream  Commonly known as:  LOTRIMIN  Apply 1 application topically 2 (two) times daily. FOR 7 DAYS     feeding supplement (JEVITY 1.2 CAL) Liqd  Place 237 mLs into feeding tube 6 (six) times daily.     guaiFENesin 100 MG/5ML liquid  Commonly known as:  ROBITUSSIN  Take 200 mg by mouth daily. Take one every day     LORazepam 2 MG/ML concentrated solution  Commonly known as:  ATIVAN  Take 0.5 mLs (1 mg total) by mouth every 6 (six) hours as needed for anxiety.     metroNIDAZOLE 50 mg/ml oral suspension  Commonly known as:  FLAGYL  Take 10 mLs (500 mg total) by mouth 3 (three) times daily. For 6 days     morphine CONCENTRATE 10 MG/0.5ML Soln concentrated solution  Take 0.5 mLs (10 mg total) by mouth every 3 (three) hours as needed for moderate pain or severe pain.     moxifloxacin 400 MG tablet  Commonly known as:   AVELOX  Take 1 tablet (400 mg total) by mouth daily at 8 pm. For 6 days     oxyCODONE-acetaminophen 10-325 MG tablet  Commonly known as:  PERCOCET  Take 1 tablet by mouth every 4 (four) hours as needed for pain.     ranitidine 150 MG capsule  Commonly known as:  ZANTAC  Take 150 mg by mouth 2 (two) times daily.     silver sulfADIAZINE 1 % cream  Commonly known as:  SILVADENE  Apply 1 application topically daily as needed. For rash/burn     spironolactone 25 MG tablet  Commonly known as:  ALDACTONE  Take 25 mg by mouth 2 (two) times daily.     torsemide 20 MG tablet  Commonly known as:  DEMADEX  Take 20 mg by mouth daily.        Major procedures and Radiology Reports - PLEASE review detailed and final reports for all details, in brief -       Dg Abd 1 View  03/16/2016  CLINICAL DATA:  Leaking percutaneous gastrostomy tube at the skin site after tube down sized at outside institution. EXAM: ABDOMEN - 1 VIEW COMPARISON:  None. FINDINGS: Percutaneous gastrostomy tube in good position with inflated retention balloon overlapping the distal gastric lumen. Contrast was injected, opacifying the distal stomach and duodenal C-loop. Calcification along the pancreatic bed consistent with chronic pancreatitis. No peritoneal leakage is seen. Some skin leakage is seen around the retention disc on the second image. There is no gastric outlet obstruction including by the balloon. No evidence of catheter leakage. Reticular markings at the right base, likely chronic. IMPRESSION: 1. Located percutaneous gastrostomy tube. No gastric outlet obstruction or catheter leakage. 2. Calcified chronic pancreatitis. Electronically Signed   By: Monte Fantasia M.D.   On: 03/16/2016 12:01   Dg Chest Port 1 View  03/22/2016  CLINICAL DATA:  Sepsis and shortness of breath tonight. EXAM: PORTABLE CHEST 1 VIEW COMPARISON:  03/21/2016 FINDINGS: Shallow inspiration with atelectasis or infiltration in the lung bases. No  change since prior study. Heart size and pulmonary vascularity are normal. Mediastinal contours appear intact. Tracheostomy. Old right clavicular fracture. No blunting of costophrenic angles. No pneumothorax. IMPRESSION: Shallow inspiration with atelectasis or infiltration in the lung bases. Electronically Signed   By: Lucienne Capers M.D.   On: 03/22/2016 04:45   Dg Chest Port 1 View  03/21/2016  CLINICAL DATA:  Shortness of breath, trach EXAM: PORTABLE CHEST 1 VIEW COMPARISON:  03/08/2016 FINDINGS: Mild bibasilar opacities, atelectasis versus pneumonia. No pleural effusion or pneumothorax. Tracheostomy in satisfactory position. Heart is normal in size. IMPRESSION: Mild bibasilar opacities, atelectasis versus pneumonia. Electronically Signed   By: Julian Hy M.D.   On: 03/21/2016 22:20    Micro Results      Recent Results (from the past 240 hour(s))  Culture, respiratory (NON-Expectorated)     Status: None   Collection Time: 03/21/16 11:00 PM  Result Value Ref Range Status   Specimen Description TRACHEAL ASPIRATE  Final   Special Requests NONE  Final   Gram Stain   Final    FEW WBC PRESENT, PREDOMINANTLY MONONUCLEAR RARE SQUAMOUS EPITHELIAL CELLS PRESENT ABUNDANT GRAM POSITIVE COCCI IN PAIRS IN CLUSTERS FEW GRAM POSITIVE RODS Performed at [facility]    Culture   Final    NORMAL OROPHARYNGEAL FLORA Performed at [facility]    Report Status 03/24/2016 FINAL  Final  Blood Culture (routine x 2)     Status: Abnormal   Collection Time: 03/21/16 11:02 PM  Result Value Ref Range Status   Specimen Description BLOOD LEFT ARM  Final   Special Requests BOTTLES DRAWN AEROBIC AND ANAEROBIC 5ML  Final   Culture  Setup Time   Final    GRAM POSITIVE COCCI IN CLUSTERS ANAEROBIC BOTTLE ONLY CRITICAL RESULT CALLED TO,  READ BACK BY AND VERIFIED WITH: Olga Coaster @0543  03/24/16 MKELLY    Culture (A)  Final    STAPHYLOCOCCUS SPECIES (COAGULASE NEGATIVE) THE SIGNIFICANCE OF  ISOLATING THIS ORGANISM FROM A SINGLE SET OF BLOOD CULTURES WHEN MULTIPLE SETS ARE DRAWN IS UNCERTAIN. PLEASE NOTIFY THE MICROBIOLOGY DEPARTMENT WITHIN ONE WEEK IF SPECIATION AND SENSITIVITIES ARE REQUIRED.    Report Status 03/26/2016 FINAL  Final  Blood Culture (routine x 2)     Status: None (Preliminary result)   Collection Time: 03/21/16 11:14 PM  Result Value Ref Range Status   Specimen Description BLOOD LEFT HAND  Final   Special Requests IN PEDIATRIC BOTTLE 3ML  Final   Culture NO GROWTH 4 DAYS  Final   Report Status PENDING  Incomplete  Urine culture     Status: Abnormal   Collection Time: 03/22/16  1:20 AM  Result Value Ref Range Status   Specimen Description URINE, RANDOM  Final   Special Requests NONE  Final   Culture 3,000 COLONIES/mL INSIGNIFICANT GROWTH (A)  Final   Report Status 03/23/2016 FINAL  Final  MRSA PCR Screening     Status: None   Collection Time: 03/22/16  8:03 PM  Result Value Ref Range Status   MRSA by PCR NEGATIVE NEGATIVE Final    Comment:        The GeneXpert MRSA Assay (FDA approved for NASAL specimens only), is one component of a comprehensive MRSA colonization surveillance program. It is not intended to diagnose MRSA infection nor to guide or monitor treatment for MRSA infections.        Today   Subjective    Philip Beck today has no headache,no chest abdominal pain,no new weakness tingling or numbness, feels much better.   Objective   Blood pressure 97/73, pulse 96, temperature 97.6 F (36.4 C), temperature source Oral, resp. rate 16, height 5\' 5"  (1.651 m), weight 48.4 kg (106 lb 11.2 oz), SpO2 99 %.   Intake/Output Summary (Last 24 hours) at 03/27/16 0945 Last data filed at 03/27/16 0500  Gross per 24 hour  Intake   1185 ml  Output   1700 ml  Net   -515 ml    Exam Awake Alert, Oriented x 3, No new F.N deficits, Normal affect [place].AT,PERRAL, Trach site stable with copious secretions, Supple Neck,No JVD, No cervical  lymphadenopathy appriciated.  Symmetrical Chest wall movement, Good air movement bilaterally, CTAB RRR,No Gallops,Rubs or new Murmurs, No Parasternal Heave +ve B.Sounds, Abd Soft, Non tender, No organomegaly appriciated, No rebound -guarding or rigidity.  peg tube in place No Cyanosis, Clubbing or edema, No new Rash or bruise   Data Review   CBC w Diff:  Lab Results  Component Value Date   WBC 15.8* 03/23/2016   HGB 8.6* 03/23/2016   HCT 27.2* 03/23/2016   PLT 249 03/23/2016   LYMPHOPCT 4 03/21/2016   MONOPCT 9 03/21/2016   EOSPCT 1 03/21/2016   BASOPCT 0 03/21/2016    CMP:  Lab Results  Component Value Date   NA 137 03/26/2016   K 3.6 03/26/2016   CL 100* 03/26/2016   CO2 26 03/26/2016   BUN 26* 03/26/2016   CREATININE 0.89 03/26/2016  .   Total Time in preparing paper work, data evaluation and todays exam - 35 minutes  Thurnell Lose M.D on 03/27/2016 at 9:45 AM  [facility]   Office  [phone]

## 2016-03-27 LAB — CULTURE, BLOOD (ROUTINE X 2): CULTURE: NO GROWTH

## 2016-03-27 LAB — GLUCOSE, CAPILLARY
GLUCOSE-CAPILLARY: 156 mg/dL — AB (ref 65–99)
GLUCOSE-CAPILLARY: 158 mg/dL — AB (ref 65–99)
Glucose-Capillary: 118 mg/dL — ABNORMAL HIGH (ref 65–99)
Glucose-Capillary: 185 mg/dL — ABNORMAL HIGH (ref 65–99)

## 2016-03-27 NOTE — Progress Notes (Signed)
Patient left via [facility] for Hospice.

## 2016-03-27 NOTE — Progress Notes (Signed)
Patient has a bed @ [facility]. Called report to receiving facility. All questions answered. Son Garavaglia) notified of room number.

## 2016-05-05 DEATH — deceased

## 2017-05-28 IMAGING — DX DG ABDOMEN 1V
2 series · 2 of 2 positions shown · non-contrast
Comparison: None.

CLINICAL DATA: Leaking percutaneous gastrostomy tube at the skin
site after tube down sized at outside institution.

EXAM:
ABDOMEN - 1 VIEW

[abdomen kub (1 of 2)]
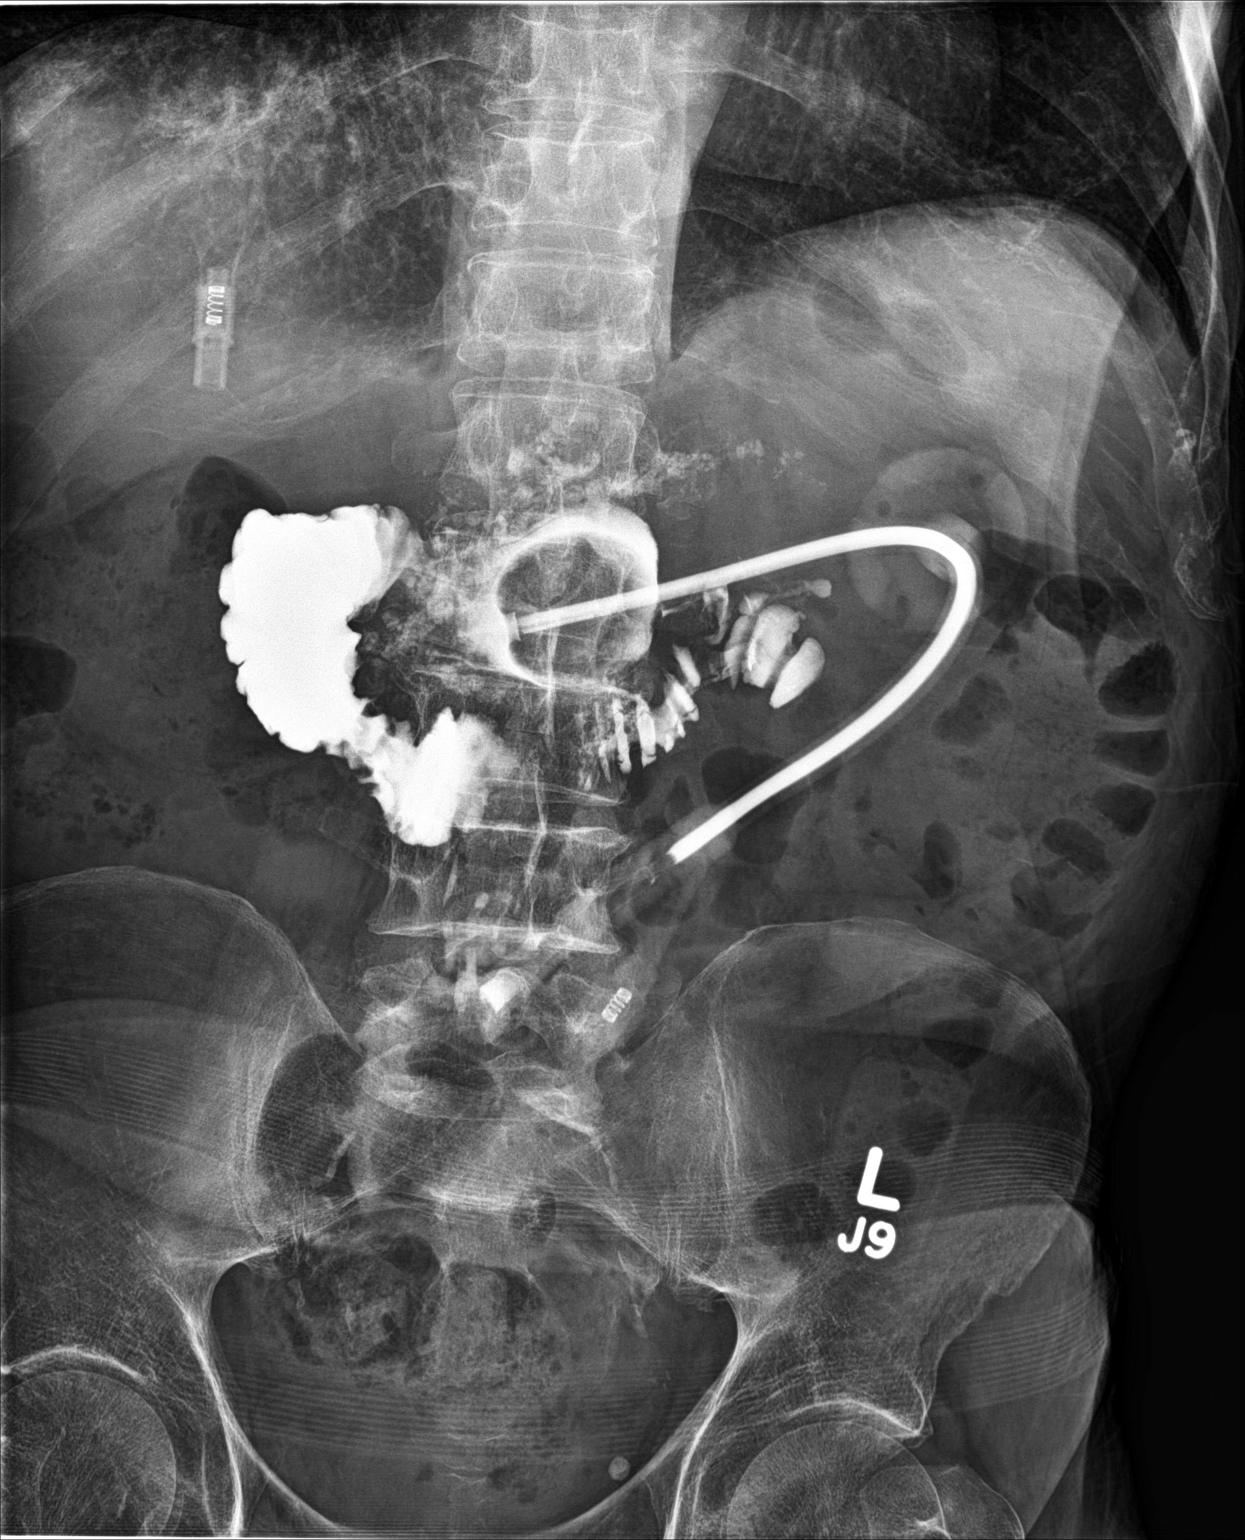

[abdomen kub (2 of 2)]
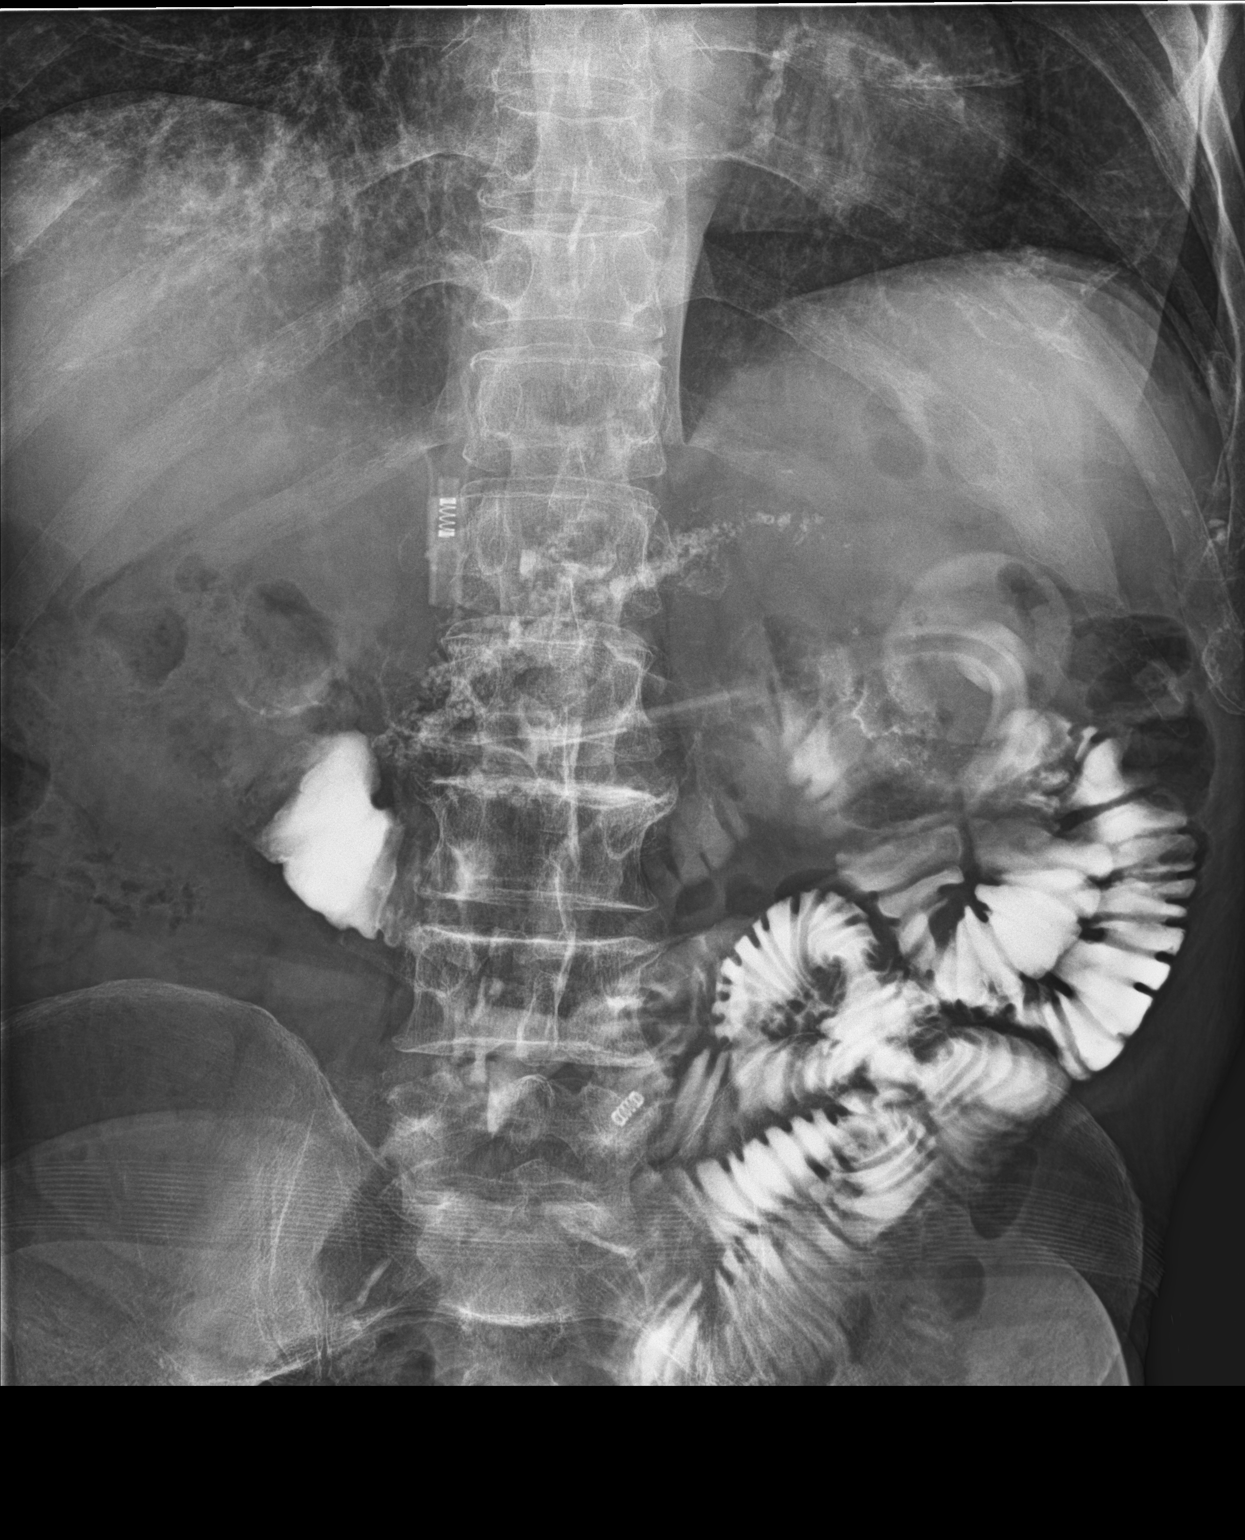

[2 of 2 positions shown; findings below may reference images not displayed]

FINDINGS: Percutaneous gastrostomy tube in good position with inflated
retention balloon overlapping the distal gastric lumen. Contrast was
injected, opacifying the distal stomach and duodenal C-loop.
Calcification along the pancreatic bed consistent with chronic
pancreatitis. No peritoneal leakage is seen. Some skin leakage is
seen around the retention disc on the second image. There is no
gastric outlet obstruction including by the balloon. No evidence of
catheter leakage.

Reticular markings at the right base, likely chronic.
IMPRESSION: 1. Located percutaneous gastrostomy tube. No gastric outlet
obstruction or catheter leakage.
2. Calcified chronic pancreatitis.
